# Patient Record
Sex: Female | Born: 1955 | Race: Black or African American | Hispanic: No | Smoking: Former smoker
Health system: Southern US, Community
[De-identification: ages and names within clinical notes are randomized; demographics above are authoritative.]

## PROBLEM LIST (undated history)

## (undated) DIAGNOSIS — K219 Gastro-esophageal reflux disease without esophagitis: Secondary | ICD-10-CM

## (undated) DIAGNOSIS — M25519 Pain in unspecified shoulder: Secondary | ICD-10-CM

## (undated) DIAGNOSIS — F329 Major depressive disorder, single episode, unspecified: Secondary | ICD-10-CM

## (undated) DIAGNOSIS — M1990 Unspecified osteoarthritis, unspecified site: Secondary | ICD-10-CM

## (undated) DIAGNOSIS — I10 Essential (primary) hypertension: Secondary | ICD-10-CM

## (undated) DIAGNOSIS — F32A Depression, unspecified: Secondary | ICD-10-CM

## (undated) DIAGNOSIS — G8929 Other chronic pain: Secondary | ICD-10-CM

## (undated) DIAGNOSIS — M549 Dorsalgia, unspecified: Secondary | ICD-10-CM

## (undated) HISTORY — PX: FOOT FRACTURE SURGERY: SHX645

## (undated) HISTORY — PX: SPINAL FUSION: SHX223

## (undated) HISTORY — DX: Depression, unspecified: F32.A

## (undated) HISTORY — DX: Major depressive disorder, single episode, unspecified: F32.9

## (undated) HISTORY — PX: ROTATOR CUFF REPAIR: SHX139

## (undated) HISTORY — DX: Gastro-esophageal reflux disease without esophagitis: K21.9

---

## 2009-07-01 HISTORY — PX: REPLACEMENT TOTAL KNEE BILATERAL: SUR1225

## 2017-08-26 ENCOUNTER — Emergency Department (HOSPITAL_COMMUNITY)
Admission: EM | Admit: 2017-08-26 | Discharge: 2017-08-26 | Disposition: A | Discharge status: Home / Self Care | Payer: Medicaid - Out of State | Attending: Emergency Medicine | Admitting: Emergency Medicine

## 2017-08-26 ENCOUNTER — Other Ambulatory Visit: Payer: Self-pay

## 2017-08-26 ENCOUNTER — Encounter (HOSPITAL_COMMUNITY): Payer: Self-pay | Admitting: Emergency Medicine

## 2017-08-26 DIAGNOSIS — Z87891 Personal history of nicotine dependence: Secondary | ICD-10-CM | POA: Diagnosis not present

## 2017-08-26 DIAGNOSIS — G8929 Other chronic pain: Secondary | ICD-10-CM | POA: Diagnosis not present

## 2017-08-26 DIAGNOSIS — M25511 Pain in right shoulder: Secondary | ICD-10-CM | POA: Diagnosis present

## 2017-08-26 DIAGNOSIS — Z96653 Presence of artificial knee joint, bilateral: Secondary | ICD-10-CM | POA: Insufficient documentation

## 2017-08-26 DIAGNOSIS — I10 Essential (primary) hypertension: Secondary | ICD-10-CM | POA: Insufficient documentation

## 2017-08-26 HISTORY — DX: Unspecified osteoarthritis, unspecified site: M19.90

## 2017-08-26 HISTORY — DX: Essential (primary) hypertension: I10

## 2017-08-26 MED ORDER — BACLOFEN 10 MG PO TABS: 10.0000 mg | ORAL_TABLET | Freq: Three times a day (TID) | ORAL | 0 refills | Status: DC

## 2017-08-26 NOTE — ED Provider Notes (Signed)
[facility] EMERGENCY DEPARTMENT Provider Note   CSN: [id number] Arrival date & time: 08/26/17  1101     History   Chief Complaint Chief Complaint  Patient presents with  . Shoulder Pain    HPI Melissa Solis is a 62 y.o. female.  HPI  Melissa Solis is a 62 y.o. female who presents to the Emergency Department complaining of right shoulder pain for 5 weeks.  She states she recently moved to this area from  where she had surgical repair of a rotator cuff injury 5 weeks ago.  She states that she was in her fourth week of physical therapy when she moved to this area.  She states she is been unable to follow-up with an orthopedic or primary care provider.  She states she is currently out of her pain medication complains of pain associated with movement of her right arm.  She denies neck pain, numbness, swelling of the extremity, fever, chills, and chest pain.  No recent injury noted   Past Medical History:  Diagnosis Date  . Arthritis   . Hypertension     There are no active problems to display for this patient.   Past Surgical History:  Procedure Laterality Date  . REPLACEMENT TOTAL KNEE BILATERAL    . ROTATOR CUFF REPAIR    . SPINAL FUSION      OB History    No data available       Home Medications    Prior to Admission medications   Medication Sig Start Date End Date Taking? Authorizing Provider  baclofen (LIORESAL) 10 MG tablet Take 1 tablet (10 mg total) by mouth 3 (three) times daily. 08/26/17   Pauline Aus, PA-C    Family History No family history on file.  Social History Social History   Tobacco Use  . Smoking status: Former Games developer  . Smokeless tobacco: Never Used  Substance Use Topics  . Alcohol use: No    Frequency: Never  . Drug use: No     Allergies   Patient has no allergy information on record.   Review of Systems Review of Systems  Constitutional: Negative for chills and fever.  Cardiovascular: Negative for chest  pain.  Gastrointestinal: Negative for abdominal pain, nausea and vomiting.  Musculoskeletal: Positive for arthralgias (Right shoulder pain). Negative for joint swelling, neck pain and neck stiffness.  Skin: Negative for color change and wound.  Neurological: Negative for weakness, numbness and headaches.  All other systems reviewed and are negative.    Physical Exam Updated Vital Signs BP (!) 143/98 (BP Location: Right Arm)   Pulse 75   Temp 98.6 F (37 C) (Oral)   Resp 18   Ht 5\' 6"  (1.676 m)   Wt 96.2 kg (212 lb)   SpO2 100%   BMI 34.22 kg/m   Physical Exam  Constitutional: She is oriented to person, place, and time. She appears well-developed and well-nourished. No distress.  HENT:  Head: Normocephalic and atraumatic.  Neck: Normal range of motion. Neck supple. No thyromegaly present.  Cardiovascular: Normal rate, regular rhythm and intact distal pulses.  No murmur heard. Radial pulses strong and palpable bilaterally  Pulmonary/Chest: Effort normal and breath sounds normal. No respiratory distress. She exhibits no tenderness.  Musculoskeletal: She exhibits tenderness. She exhibits no edema.  Tender to palpation of the anterior and lateral right shoulder.  Well healing surgical scars present.  Pain with abduction at 30 degrees.  No edema, erythema, or excessive warmth.  5 out of 5  grip strength of the upper extremities.  Lymphadenopathy:    She has no cervical adenopathy.  Neurological: She is alert and oriented to person, place, and time. She has normal strength. No sensory deficit. She exhibits normal muscle tone. Coordination normal.  Reflex Scores:      Tricep reflexes are 1+ on the right side and 2+ on the left side.      Bicep reflexes are 1+ on the right side and 2+ on the left side. Skin: Skin is warm. Capillary refill takes less than 2 seconds. No rash noted.  Psychiatric: She has a normal mood and affect.  Nursing note and vitals reviewed.    ED Treatments /  Results  Labs (all labs ordered are listed, but only abnormal results are displayed) Labs Reviewed - No data to display  EKG  EKG Interpretation None       Radiology No results found.  Procedures Procedures (including critical care time)  Medications Ordered in ED Medications - No data to display   Initial Impression / Assessment and Plan / ED Course  I have reviewed the triage vital signs and the nursing notes.  Pertinent labs & imaging results that were available during my care of the patient were reviewed by me and considered in my medical decision making (see chart for details).     Patient with acute on chronic right shoulder pain.  Neurovascularly intact.  No concerning symptoms for septic joint.  No new injuries.  Patient is requesting narcotic pain medication, I have explained that she will need to arrange follow-up appointment or establish primary care for ongoing pain management, she appears stable for discharge and agrees to treatment plan.  We will give referral information for local orthopedics to reestablish physical therapy   Final Clinical Impressions(s) / ED Diagnoses   Final diagnoses:  Chronic right shoulder pain    ED Discharge Orders        Ordered    baclofen (LIORESAL) 10 MG tablet  3 times daily     08/26/17 1433       Pauline Ausriplett, Kamrin Sibley, PA-C 08/26/17 2043    Loren RacerYelverton, David, MD 08/29/17 1526

## 2017-08-26 NOTE — ED Triage Notes (Signed)
Pt has rotator cuff surgery in January on right shoulder. C/o of pain and limited movement. Pt states she wants to get into therapy.

## 2017-08-26 NOTE — Discharge Instructions (Signed)
Call Dr. Mort SawyersHarrison's office to arrange a follow-up appointment.  Continue to apply ice packs on and off to your shoulder.  Return to the ER for any worsening symptoms  [facility] Doctor List    Kari BaarsEdward Hawkins MD. Specialty: Pulmonary Disease Contact information: [address]  [address]  [place] [address]  [phone]   Syliva OvermanMargaret Simpson, MD. Specialty: [facility] Contact information: [address]  [place] [address]  [phone]   Lilyan PuntScott Luking, MD. Specialty: [facility] Contact information: [address]  [address]  [place] [address]  [phone]   Avon Gullyesfaye Fanta, MD Specialty: Internal Medicine Contact information: [address]   [place] [address]  [phone]   Catalina PizzaZach Hall, MD. Specialty: Internal Medicine Contact information: [address]  [place] [address]  [phone]    [facility] (Dr. Selena BattenKim) Specialty: Family Medicine Contact information: [address]  [place] [address]  [phone]   John GiovanniStephen Knowlton, MD. Specialty: [facility] Contact information: [address]  [address]  [place] [address]  [phone]   Carylon Perchesoy Fagan, MD. Specialty: Internal Medicine Contact information: [address]  [address]  [place] [address]  [phone]    [facility]  [address] [place] [address] [phone]  Services [facility] offers a variety of basic health services.  Services include but are not limited to: Blood pressure checks  Heart rate checks  Blood sugar checks  Urine analysis  Rapid strep tests  Pregnancy tests.  Health education and referrals  People needing more complex services will be directed to a physician online. Using these virtual visits, doctors can evaluate and prescribe medicine and treatments. There will be no medication on-site, though [facility] will help patients fill their prescriptions at little to no cost.   For More information please go to: [email or web address]

## 2017-09-04 ENCOUNTER — Encounter: Payer: Self-pay | Admitting: Physician Assistant

## 2017-09-04 ENCOUNTER — Ambulatory Visit: Payer: Self-pay | Admitting: Physician Assistant

## 2017-09-04 VITALS — BP 122/90 | HR 79 | Temp 97.5°F | Ht 65.75 in | Wt 214.5 lb

## 2017-09-04 DIAGNOSIS — Z13220 Encounter for screening for lipoid disorders: Secondary | ICD-10-CM

## 2017-09-04 DIAGNOSIS — Z131 Encounter for screening for diabetes mellitus: Secondary | ICD-10-CM

## 2017-09-04 DIAGNOSIS — E669 Obesity, unspecified: Secondary | ICD-10-CM

## 2017-09-04 DIAGNOSIS — M51369 Other intervertebral disc degeneration, lumbar region without mention of lumbar back pain or lower extremity pain: Secondary | ICD-10-CM

## 2017-09-04 DIAGNOSIS — I10 Essential (primary) hypertension: Secondary | ICD-10-CM

## 2017-09-04 DIAGNOSIS — Z981 Arthrodesis status: Secondary | ICD-10-CM

## 2017-09-04 DIAGNOSIS — Z1239 Encounter for other screening for malignant neoplasm of breast: Secondary | ICD-10-CM

## 2017-09-04 DIAGNOSIS — Z9889 Other specified postprocedural states: Secondary | ICD-10-CM

## 2017-09-04 DIAGNOSIS — M5136 Other intervertebral disc degeneration, lumbar region: Secondary | ICD-10-CM

## 2017-09-04 DIAGNOSIS — M25511 Pain in right shoulder: Secondary | ICD-10-CM

## 2017-09-04 DIAGNOSIS — Z7689 Persons encountering health services in other specified circumstances: Secondary | ICD-10-CM

## 2017-09-04 DIAGNOSIS — M48061 Spinal stenosis, lumbar region without neurogenic claudication: Secondary | ICD-10-CM

## 2017-09-04 MED ORDER — METOPROLOL TARTRATE 100 MG PO TABS
100.0000 mg | ORAL_TABLET | Freq: Two times a day (BID) | ORAL | 1 refills | Status: DC
Start: 2017-09-04 — End: 2017-09-11

## 2017-09-04 MED ORDER — VALSARTAN-HYDROCHLOROTHIAZIDE 160-12.5 MG PO TABS: 1.0000 | ORAL_TABLET | Freq: Every day | ORAL | 1 refills | Status: DC

## 2017-09-04 NOTE — Patient Instructions (Signed)
-  mammogram -Labs- -medassist-  Omeprazole, valsartan, hctz, metoprolol -Physical therapy for shoulder - orthopedist for shoulder and back [facility]-Charity care application

## 2017-09-04 NOTE — Progress Notes (Signed)
BP 122/90 (BP Location: Left Arm, Patient Position: Sitting, Cuff Size: Normal)   Pulse 79   Temp (!) 97.5 F (36.4 C)   Ht 5' 5.75" (1.67 m)   Wt 214 lb 8 oz (97.3 kg)   SpO2 99%   BMI 34.88 kg/m    Subjective:    Patient ID: Melissa Solis, female    DOB: 1955/10/13, 63 y.o.   MRN: [id number]  HPI: Melissa Solis is a 62 y.o. female presenting on 09/04/2017 for New Patient (Initial Visit) (pt moved from [place] on 08-21-17.)   HPI   Chief Complaint  Patient presents with  . New Patient (Initial Visit)    pt moved from [place] on 08-21-17.   She is living with her son and daughter-in-law and their 6 children.  She says it is a somewhat stressful situation because several of the teens are disrespectful.  She also does not have her own room and is sleeping in the living room.  She is hoping to get a place of her own to live.    Pt had Recent R shoulder surgery- arthroscopic rotator cuff repair, double row, on 07/24/2017 in [place].  She had started physical therapy prior to moving and can tell that it has worsened since she is no longer doing her therapy.    She Thinks she might need medication for depression due to the stress of moving and her current living situation.   Pt states back pain- chronic-  She had fusion in 2013.  she had MRI of lumbar spine November 2018.  pt says she was told she needed back surgery.  Review of office note states surgery was NOT recommended.   She saw orthopedist in December for her back.     Pt has been on blood pressure medication for years.  She denies history of heart problems.    Pt quit smoking in 2018.    Relevant past medical, surgical, family and social history reviewed and updated as indicated. Interim medical history since our last visit reviewed. Allergies and medications reviewed and updated.   Current Outpatient Medications:  .  baclofen (LIORESAL) 10 MG tablet, Take 1 tablet (10 mg total) by mouth 3 (three) times daily. (Patient taking  differently: Take 10 mg by mouth 2 (two) times daily as needed for muscle spasms (pain). ), Disp: 30 each, Rfl: 0 .  cetirizine (ZYRTEC) 10 MG tablet, Take 10 mg by mouth daily., Disp: , Rfl:  .  diclofenac sodium (VOLTAREN) 1 % GEL, Apply topically as needed., Disp: , Rfl:  .  DOCUSATE SODIUM PO, Take 1 tablet by mouth as needed., Disp: , Rfl:  .  lansoprazole (PREVACID) 15 MG capsule, Take 15 mg by mouth daily at 12 Solis., Disp: , Rfl:  .  lidocaine (LIDODERM) 5 %, Place 1 patch onto the skin as needed (moderate pain). Leave on for 12 hrs and then off for 12 hours, Disp: , Rfl:  .  nabumetone (RELAFEN) 500 MG tablet, Take 500 mg by mouth daily., Disp: , Rfl:  .  Nebivolol HCl (BYSTOLIC) 20 MG TABS, Take 1 tablet by mouth daily., Disp: , Rfl:  .  valsartan-hydrochlorothiazide (DIOVAN-HCT) 160-12.5 MG tablet, Take 1 tablet by mouth daily., Disp: , Rfl:   Review of Systems  Constitutional: Positive for appetite change, fatigue and unexpected weight change. Negative for chills, diaphoresis and fever.  HENT: Positive for ear pain and facial swelling. Negative for congestion, drooling, hearing loss, mouth sores, sneezing, sore throat, trouble  swallowing and voice change.   Eyes: Positive for pain and visual disturbance. Negative for discharge, redness and itching.  Respiratory: Positive for shortness of breath and wheezing. Negative for cough and choking.   Cardiovascular: Positive for palpitations and leg swelling. Negative for chest pain.  Gastrointestinal: Negative for abdominal pain, blood in stool, constipation, diarrhea and vomiting.  Endocrine: Positive for cold intolerance. Negative for heat intolerance and polydipsia.  Genitourinary: Negative for decreased urine volume, dysuria and hematuria.  Musculoskeletal: Positive for arthralgias, back pain and gait problem.  Skin: Negative for rash.  Allergic/Immunologic: Negative for environmental allergies.  Neurological: Negative for seizures,  syncope, light-headedness and headaches.  Hematological: Negative for adenopathy.  Psychiatric/Behavioral: Positive for agitation and dysphoric mood. Negative for suicidal ideas. The patient is nervous/anxious.     Per HPI unless specifically indicated above     Objective:    BP 122/90 (BP Location: Left Arm, Patient Position: Sitting, Cuff Size: Normal)   Pulse 79   Temp (!) 97.5 F (36.4 C)   Ht 5' 5.75" (1.67 m)   Wt 214 lb 8 oz (97.3 kg)   SpO2 99%   BMI 34.88 kg/m   Wt Readings from Last 3 Encounters:  09/04/17 214 lb 8 oz (97.3 kg)  08/26/17 212 lb (96.2 kg)    Physical Exam  Constitutional: She is oriented to person, place, and time. She appears well-developed and well-nourished.  HENT:  Head: Normocephalic and atraumatic.  Mouth/Throat: Oropharynx is clear and moist. No oropharyngeal exudate.  Eyes: Conjunctivae and EOM are normal. Pupils are equal, round, and reactive to light.  Neck: Neck supple. No thyromegaly present.  Cardiovascular: Normal rate and regular rhythm.  Pulmonary/Chest: Effort normal and breath sounds normal.  Abdominal: Soft. Bowel sounds are normal. She exhibits no mass. There is no hepatosplenomegaly. There is no tenderness.  Musculoskeletal: She exhibits no edema.       Right shoulder: She exhibits decreased range of motion and tenderness. She exhibits no bony tenderness, no swelling and normal pulse.       Lumbar back: She exhibits decreased range of motion and tenderness.  Lymphadenopathy:    She has no cervical adenopathy.  Neurological: She is alert and oriented to person, place, and time. Gait normal.  Skin: Skin is warm and dry.  Psychiatric: She has a normal mood and affect. Her behavior is normal.  Vitals reviewed.    No results found for this or any previous visit.    Assessment & Plan:    Encounter Diagnoses  Name Primary?  . Encounter to establish care Yes  . Essential hypertension   . S/P shoulder surgery   . Right  shoulder pain, unspecified chronicity   . DDD (degenerative disc disease), lumbar   . History of lumbar fusion   . Spinal stenosis of lumbar region, unspecified whether neurogenic claudication present   . Screening cholesterol level   . Screening for diabetes mellitus   . Screening for breast cancer   . Obesity, unspecified classification, unspecified obesity type, unspecified whether serious comorbidity present       -ordered screening mammogram -will get baseline labs -will get pt signed up for medassist- (Omeprazole, valsartan, hctz, metoprolol) -refer for Physical therapy for shoulder -Refer to orthopedist for shoulder and back -pt was given application for [facility] care  -At next OV in one month, will discuss mood, colon cancer screening, cervical cancer screening, nutrition.  Pt to RTO sooner prn

## 2017-09-05 ENCOUNTER — Other Ambulatory Visit (HOSPITAL_COMMUNITY)
Admission: RE | Admit: 2017-09-05 | Discharge: 2017-09-05 | Disposition: A | Discharge status: Home / Self Care | Payer: Medicaid - Out of State | Source: Ambulatory Visit | Attending: Physician Assistant | Admitting: Physician Assistant

## 2017-09-05 DIAGNOSIS — Z7689 Persons encountering health services in other specified circumstances: Secondary | ICD-10-CM

## 2017-09-05 DIAGNOSIS — Z13220 Encounter for screening for lipoid disorders: Secondary | ICD-10-CM

## 2017-09-05 DIAGNOSIS — Z131 Encounter for screening for diabetes mellitus: Secondary | ICD-10-CM

## 2017-09-05 DIAGNOSIS — I10 Essential (primary) hypertension: Secondary | ICD-10-CM

## 2017-09-05 LAB — CBC
HCT: 35.1 % — ABNORMAL LOW (ref 36.0–46.0)
HEMOGLOBIN: 11.5 g/dL — AB (ref 12.0–15.0)
MCH: 29.6 pg (ref 26.0–34.0)
MCHC: 32.8 g/dL (ref 30.0–36.0)
MCV: 90.2 fL (ref 78.0–100.0)
Platelets: 272 10*3/uL (ref 150–400)
RBC: 3.89 MIL/uL (ref 3.87–5.11)
RDW: 12.6 % (ref 11.5–15.5)
WBC: 3.2 10*3/uL — ABNORMAL LOW (ref 4.0–10.5)

## 2017-09-05 LAB — COMPREHENSIVE METABOLIC PANEL
ALBUMIN: 4.1 g/dL (ref 3.5–5.0)
ALT: 14 U/L (ref 14–54)
ANION GAP: 10 (ref 5–15)
AST: 18 U/L (ref 15–41)
Alkaline Phosphatase: 93 U/L (ref 38–126)
BILIRUBIN TOTAL: 0.7 mg/dL (ref 0.3–1.2)
BUN: 24 mg/dL — ABNORMAL HIGH (ref 6–20)
CO2: 26 mmol/L (ref 22–32)
Calcium: 9.3 mg/dL (ref 8.9–10.3)
Chloride: 101 mmol/L (ref 101–111)
Creatinine, Ser: 1.19 mg/dL — ABNORMAL HIGH (ref 0.44–1.00)
GFR calc Af Amer: 56 mL/min — ABNORMAL LOW (ref >60)
GFR calc non Af Amer: 48 mL/min — ABNORMAL LOW (ref >60)
GLUCOSE: 104 mg/dL — AB (ref 65–99)
POTASSIUM: 4 mmol/L (ref 3.5–5.1)
Sodium: 137 mmol/L (ref 135–145)
TOTAL PROTEIN: 7.6 g/dL (ref 6.5–8.1)

## 2017-09-05 LAB — LIPID PANEL
CHOL/HDL RATIO: 3.2 ratio
CHOLESTEROL: 128 mg/dL (ref 0–200)
HDL: 40 mg/dL — ABNORMAL LOW (ref >40)
LDL Cholesterol: 75 mg/dL (ref 0–99)
Triglycerides: 65 mg/dL (ref <150)
VLDL: 13 mg/dL (ref 0–40)

## 2017-09-06 ENCOUNTER — Other Ambulatory Visit: Payer: Self-pay

## 2017-09-06 ENCOUNTER — Emergency Department (HOSPITAL_COMMUNITY)
Admission: EM | Admit: 2017-09-06 | Discharge: 2017-09-06 | Disposition: A | Discharge status: Home / Self Care | Payer: Medicaid - Out of State | Attending: Emergency Medicine | Admitting: Emergency Medicine

## 2017-09-06 ENCOUNTER — Encounter (HOSPITAL_COMMUNITY): Payer: Self-pay | Admitting: Emergency Medicine

## 2017-09-06 ENCOUNTER — Emergency Department (HOSPITAL_COMMUNITY): Payer: Medicaid - Out of State

## 2017-09-06 DIAGNOSIS — Y939 Activity, unspecified: Secondary | ICD-10-CM | POA: Diagnosis not present

## 2017-09-06 DIAGNOSIS — Z79899 Other long term (current) drug therapy: Secondary | ICD-10-CM | POA: Diagnosis not present

## 2017-09-06 DIAGNOSIS — S40011A Contusion of right shoulder, initial encounter: Secondary | ICD-10-CM | POA: Diagnosis not present

## 2017-09-06 DIAGNOSIS — Y998 Other external cause status: Secondary | ICD-10-CM | POA: Insufficient documentation

## 2017-09-06 DIAGNOSIS — Z87891 Personal history of nicotine dependence: Secondary | ICD-10-CM | POA: Diagnosis not present

## 2017-09-06 DIAGNOSIS — I10 Essential (primary) hypertension: Secondary | ICD-10-CM | POA: Insufficient documentation

## 2017-09-06 DIAGNOSIS — W2209XA Striking against other stationary object, initial encounter: Secondary | ICD-10-CM | POA: Insufficient documentation

## 2017-09-06 DIAGNOSIS — Y929 Unspecified place or not applicable: Secondary | ICD-10-CM | POA: Insufficient documentation

## 2017-09-06 DIAGNOSIS — S4991XA Unspecified injury of right shoulder and upper arm, initial encounter: Secondary | ICD-10-CM | POA: Diagnosis present

## 2017-09-06 HISTORY — DX: Other chronic pain: G89.29

## 2017-09-06 HISTORY — DX: Dorsalgia, unspecified: M54.9

## 2017-09-06 HISTORY — DX: Pain in unspecified shoulder: M25.519

## 2017-09-06 LAB — HEMOGLOBIN A1C
Hgb A1c MFr Bld: 6 % — ABNORMAL HIGH (ref 4.8–5.6)
MEAN PLASMA GLUCOSE: 126 mg/dL

## 2017-09-06 MED ORDER — ONDANSETRON HCL 4 MG PO TABS
4.0000 mg | ORAL_TABLET | Freq: Once | ORAL | Status: AC
  Administered 2017-09-06: 4 mg via ORAL
  Filled 2017-09-06: qty 1

## 2017-09-06 MED ORDER — DEXAMETHASONE SODIUM PHOSPHATE 10 MG/ML IJ SOLN
10.0000 mg | Freq: Once | INTRAMUSCULAR | Status: AC
  Administered 2017-09-06: 10 mg via INTRAMUSCULAR
  Filled 2017-09-06: qty 1

## 2017-09-06 MED ORDER — KETOROLAC TROMETHAMINE 60 MG/2ML IM SOLN
60.0000 mg | Freq: Once | INTRAMUSCULAR | Status: AC
  Administered 2017-09-06: 60 mg via INTRAMUSCULAR
  Filled 2017-09-06: qty 2

## 2017-09-06 NOTE — ED Triage Notes (Signed)
Patient complains of right shoulder pain after hitting it on a door frame today at 12pm. She states nothing makes it better. She states moving it make it worse. She states the pain is aching and stabbing. She says she had rotator cuff surgery Janurary 24th, 2019.

## 2017-09-06 NOTE — ED Provider Notes (Signed)
[facility] EMERGENCY DEPARTMENT Provider Note   CSN: [id number] Arrival date & time: 09/06/17  1452     History   Chief Complaint Chief Complaint  Patient presents with  . Shoulder Pain    HPI Chesley NoonLashel Kratky is a 62 y.o. female.  Patient is a 62 year old female who presents to the emergency department with a complaint of right shoulder pain.  The patient states that in January 2018 she had surgery on the rotator cuff.  Since that time she has moved to this area.  She was taking physical therapy, but is not had any physical therapy since the end of January.  She is attempting to be reestablished with a primary physician here in town, and also to obtain benefits.  She states that she has been having pain in her right shoulder, but this is become worse since she bumped it last night on a door frame.  She says she has pain that goes from the shoulder down to her fingertips.  She has good function.  But states that at that time she had the severe pain and she has some residual of it today.  The patient also complains of a problem with where she is staying.  She has very little heat and she states her arm throbs because of the cold damp weather.  She presents at this time for assessment after having hit her shoulder.      Past Medical History:  Diagnosis Date  . Arthritis   . Chronic back pain   . Chronic shoulder pain   . Depression   . GERD (gastroesophageal reflux disease)   . Hypertension     There are no active problems to display for this patient.   Past Surgical History:  Procedure Laterality Date  . FOOT FRACTURE SURGERY Left   . REPLACEMENT TOTAL KNEE BILATERAL  2011  . ROTATOR CUFF REPAIR Right   . SPINAL FUSION      OB History    No data available       Home Medications    Prior to Admission medications   Medication Sig Start Date End Date Taking? Authorizing Provider  baclofen (LIORESAL) 10 MG tablet Take 1 tablet (10 mg total) by mouth 3 (three) times  daily. Patient taking differently: Take 10 mg by mouth 2 (two) times daily as needed for muscle spasms (pain).  08/26/17   Triplett, Tammy, PA-C  cetirizine (ZYRTEC) 10 MG tablet Take 10 mg by mouth daily.    [provider]  diclofenac sodium (VOLTAREN) 1 % GEL Apply topically as needed.    [provider]  DOCUSATE SODIUM PO Take 1 tablet by mouth as needed.    [provider]  lansoprazole (PREVACID) 15 MG capsule Take 15 mg by mouth daily at 12 noon.    [provider]  lidocaine (LIDODERM) 5 % Place 1 patch onto the skin as needed (moderate pain). Leave on for 12 hrs and then off for 12 hours    [provider]  metoprolol tartrate (LOPRESSOR) 100 MG tablet Take 1 tablet (100 mg total) by mouth 2 (two) times daily. 09/04/17   Jacquelin HawkingMcElroy, Shannon, PA-C  nabumetone (RELAFEN) 500 MG tablet Take 500 mg by mouth daily.    [provider]  valsartan-hydrochlorothiazide (DIOVAN-HCT) 160-12.5 MG tablet Take 1 tablet by mouth daily. 09/04/17   Jacquelin HawkingMcElroy, Shannon, PA-C    Family History Family History  Problem Relation Age of Onset  . Hypertension Mother   . Diabetes  Mother   . Hypertension Sister   . Stroke Sister   . Diabetes Brother   . Heart disease Brother   . Stroke Brother   . Hypertension Maternal Uncle   . Heart attack Maternal Uncle   . Stroke Maternal Uncle   . Hypertension Maternal Grandfather   . Heart disease Maternal Grandfather     Social History Social History   Tobacco Use  . Smoking status: Former Smoker    Packs/day: 0.25    Years: 20.00    Pack years: 5.00    Types: Cigarettes    Last attempt to quit: 09/20/2016    Years since quitting: 0.9  . Smokeless tobacco: Never Used  Substance Use Topics  . Alcohol use: No    Frequency: Never    Comment: wine "every now and then"  . Drug use: No     Allergies   Ace inhibitors   Review of Systems Review of Systems  Constitutional: Negative for activity change.        All ROS Neg except as noted in HPI  HENT: Negative for nosebleeds.   Eyes: Negative for photophobia and discharge.  Respiratory: Negative for cough, shortness of breath and wheezing.   Cardiovascular: Negative for chest pain and palpitations.  Gastrointestinal: Negative for abdominal pain and blood in stool.  Genitourinary: Negative for dysuria, frequency and hematuria.  Musculoskeletal: Positive for arthralgias. Negative for back pain and neck pain.  Skin: Negative.   Neurological: Negative for dizziness, seizures and speech difficulty.  Psychiatric/Behavioral: Negative for confusion and hallucinations.     Physical Exam Updated Vital Signs BP (!) 142/88 (BP Location: Left Arm)   Pulse 90   Temp 98.8 F (37.1 C) (Oral)   Resp 16   Ht 5\' 6"  (1.676 m)   Wt 98.9 kg (218 lb)   SpO2 100%   BMI 35.19 kg/m   Physical Exam  Constitutional: She is oriented to person, place, and time. She appears well-developed and well-nourished.  Non-toxic appearance.  HENT:  Head: Normocephalic.  Right Ear: Tympanic membrane and external ear normal.  Left Ear: Tympanic membrane and external ear normal.  Eyes: EOM and lids are normal. Pupils are equal, round, and reactive to light.  Neck: Normal range of motion. Neck supple. Carotid bruit is not present.  Cardiovascular: Normal rate, regular rhythm, normal heart sounds, intact distal pulses and normal pulses.  Pulmonary/Chest: Breath sounds normal. No respiratory distress.  Abdominal: Soft. Bowel sounds are normal. There is no tenderness. There is no guarding.  Musculoskeletal:       Right shoulder: She exhibits decreased range of motion and tenderness. She exhibits no swelling and normal pulse.  There are no hot areas involving the right shoulder.  The brachial and radial pulses are 2+.  Capillary refill is less than 2 seconds.  There is decreased range of motion because of pain.  There is no evidence of a dislocation.  There is pain with  attempted range of motion.  And there is pain to palpation at the anterior shoulder and the bicep tricep area.  Lymphadenopathy:       Head (right side): No submandibular adenopathy present.       Head (left side): No submandibular adenopathy present.    She has no cervical adenopathy.  Neurological: She is alert and oriented to person, place, and time. She has normal strength. No cranial nerve deficit or sensory deficit.  Pt is ambulatory without problem.  Skin: Skin is warm and dry.  Psychiatric: She has a normal mood and affect. Her speech is normal.  Nursing note and vitals reviewed.    ED Treatments / Results  Labs (all labs ordered are listed, but only abnormal results are displayed) Labs Reviewed - No data to display  EKG  EKG Interpretation None       Radiology No results found.  Procedures Procedures (including critical care time)  Medications Ordered in ED Medications - No data to display   Initial Impression / Assessment and Plan / ED Course  I have reviewed the triage vital signs and the nursing notes.  Pertinent labs & imaging results that were available during my care of the patient were reviewed by me and considered in my medical decision making (see chart for details).       Final Clinical Impressions(s) / ED Diagnoses  MDM  Patient bumped her shoulder in a door on last evening.  She had rotator cuff surgery in January, and is not had physical therapy for over a month.  There are no neurovascular deficits appreciated.  There is no evidence of any dislocation on examination.  The patient was treated in the emergency department with intramuscular Toradol and Decadron.  I have asked the patient to use Tylenol and ibuprofen for soreness.  To follow-up with Jacquelin Hawking in the office.  Patient is in agreement with this plan at this time.   Final diagnoses:  Contusion of right shoulder, initial encounter    ED Discharge Orders    None         Ivery Quale, PA-C 09/06/17 1605    Eber Hong, MD 09/06/17 [phone]

## 2017-09-06 NOTE — Discharge Instructions (Signed)
The pulses to your upper extremities are well within normal limits.  No gross neurologic deficits appreciated.  There is no hot joint that would cause suspicion of infection within the joint area.  You were treated in the emergency department with intramuscular Toradol and intramuscular Decadron.  Please use 600 mg of ibuprofen and 500 mg of Tylenol every 6 hours as needed for shoulder pain.  I think some

## 2017-09-08 ENCOUNTER — Other Ambulatory Visit: Payer: Self-pay | Admitting: Physician Assistant

## 2017-09-08 MED ORDER — IRBESARTAN 150 MG PO TABS: 150.0000 mg | ORAL_TABLET | Freq: Every day | ORAL | 0 refills | Status: DC

## 2017-09-08 MED ORDER — HYDROCHLOROTHIAZIDE 12.5 MG PO CAPS: 12.5000 mg | ORAL_CAPSULE | Freq: Every day | ORAL | 0 refills | Status: DC

## 2017-09-11 ENCOUNTER — Other Ambulatory Visit: Payer: Self-pay | Admitting: Physician Assistant

## 2017-09-11 ENCOUNTER — Encounter: Payer: Self-pay | Admitting: Physician Assistant

## 2017-09-11 ENCOUNTER — Ambulatory Visit: Payer: Medicaid - Out of State | Admitting: Physician Assistant

## 2017-09-11 VITALS — BP 107/78 | HR 90 | Temp 97.5°F | Ht 65.75 in | Wt 216.5 lb

## 2017-09-11 DIAGNOSIS — R7303 Prediabetes: Secondary | ICD-10-CM

## 2017-09-11 DIAGNOSIS — I10 Essential (primary) hypertension: Secondary | ICD-10-CM | POA: Insufficient documentation

## 2017-09-11 DIAGNOSIS — D72819 Decreased white blood cell count, unspecified: Secondary | ICD-10-CM

## 2017-09-11 DIAGNOSIS — D649 Anemia, unspecified: Secondary | ICD-10-CM

## 2017-09-11 DIAGNOSIS — R42 Dizziness and giddiness: Secondary | ICD-10-CM

## 2017-09-11 DIAGNOSIS — N189 Chronic kidney disease, unspecified: Secondary | ICD-10-CM | POA: Insufficient documentation

## 2017-09-11 MED ORDER — METOPROLOL TARTRATE 100 MG PO TABS: 100.0000 mg | ORAL_TABLET | Freq: Two times a day (BID) | ORAL | 1 refills | Status: DC

## 2017-09-11 MED ORDER — OMEPRAZOLE 40 MG PO CPDR: 40.0000 mg | DELAYED_RELEASE_CAPSULE | Freq: Every day | ORAL | 1 refills | Status: DC

## 2017-09-11 MED ORDER — IRON COMPLEX PO CAPS: ORAL_CAPSULE | ORAL | Status: DC

## 2017-09-11 MED ORDER — HYDROCHLOROTHIAZIDE 12.5 MG PO CAPS: 12.5000 mg | ORAL_CAPSULE | Freq: Every day | ORAL | 1 refills | Status: DC

## 2017-09-11 MED ORDER — VALSARTAN 160 MG PO TABS: 160.0000 mg | ORAL_TABLET | Freq: Every day | ORAL | 1 refills | Status: DC

## 2017-09-11 NOTE — Progress Notes (Signed)
BP 107/78 (BP Location: Right Arm, Patient Position: Sitting, Cuff Size: Normal)   Pulse 90   Temp (!) 97.5 F (36.4 C)   Ht 5' 5.75" (1.67 m)   Wt 216 lb 8 oz (98.2 kg)   SpO2 100%   BMI 35.21 kg/m    Subjective:    Patient ID: Melissa Solis, female    DOB: 1956-04-18, 62 y.o.   MRN: [id number]  HPI: Melissa Solis is a 62 y.o. female presenting on 09/11/2017 for Dizziness (going on for a while but is getting worse.)   HPI Pt c/o dizziness for several months but she says it's getting worse.   Many bp meds changed at last OV but she hasn't gotten them yet and is still taking her old meds.   She relates feeling lightheadeded and room spins.  Comes and goes.  Usually happens qd.  Lasts about 10seconds but has to "stop in her tracks".  No vision changes, HA, prodromal event.  No CP or sob.  No LOC.   Relevant past medical, surgical, family and social history reviewed and updated as indicated. Interim medical history since our last visit reviewed. Allergies and medications reviewed and updated.   Current Outpatient Medications:  .  baclofen (LIORESAL) 10 MG tablet, Take 1 tablet (10 mg total) by mouth 3 (three) times daily. (Patient taking differently: Take 10 mg by mouth 2 (two) times daily as needed for muscle spasms (pain). ), Disp: 30 each, Rfl: 0 .  cetirizine (ZYRTEC) 10 MG tablet, Take 10 mg by mouth daily., Disp: , Rfl:  .  diclofenac sodium (VOLTAREN) 1 % GEL, Apply topically as needed., Disp: , Rfl:  .  DOCUSATE SODIUM PO, Take 1 tablet by mouth as needed., Disp: , Rfl:  .  lansoprazole (PREVACID) 15 MG capsule, Take 15 mg by mouth daily at 12 Solis., Disp: , Rfl:  .  nabumetone (RELAFEN) 500 MG tablet, Take 500 mg by mouth daily., Disp: , Rfl:  .  nebivolol (BYSTOLIC) 10 MG tablet, Take 20 mg by mouth daily., Disp: , Rfl:  .  valsartan-hydrochlorothiazide (DIOVAN-HCT) 160-12.5 MG tablet, Take 1 tablet by mouth daily., Disp: 30 tablet, Rfl: 1 .  hydrochlorothiazide  (MICROZIDE) 12.5 MG capsule, Take 1 capsule (12.5 mg total) by mouth daily. (Patient not taking: Reported on 09/11/2017), Disp: 30 capsule, Rfl: 0 .  irbesartan (AVAPRO) 150 MG tablet, Take 1 tablet (150 mg total) by mouth daily. (Patient not taking: Reported on 09/11/2017), Disp: 30 tablet, Rfl: 0 .  lidocaine (LIDODERM) 5 %, Place 1 patch onto the skin as needed (moderate pain). Leave on for 12 hrs and then off for 12 hours, Disp: , Rfl:  .  metoprolol tartrate (LOPRESSOR) 100 MG tablet, Take 1 tablet (100 mg total) by mouth 2 (two) times daily. (Patient not taking: Reported on 09/11/2017), Disp: 60 tablet, Rfl: 1   Review of Systems  Constitutional: Positive for appetite change, fatigue and unexpected weight change. Negative for chills, diaphoresis and fever.  HENT: Positive for congestion. Negative for dental problem, drooling, ear pain, facial swelling, hearing loss, mouth sores, sneezing, sore throat, trouble swallowing and voice change.   Eyes: Positive for visual disturbance. Negative for pain, discharge, redness and itching.  Respiratory: Positive for shortness of breath and wheezing. Negative for cough and choking.   Cardiovascular: Positive for leg swelling. Negative for chest pain and palpitations.  Gastrointestinal: Negative for abdominal pain, blood in stool, constipation, diarrhea and vomiting.  Endocrine: Positive for polydipsia. Negative  for cold intolerance and heat intolerance.  Genitourinary: Positive for decreased urine volume. Negative for dysuria and hematuria.  Musculoskeletal: Positive for arthralgias, back pain and gait problem.  Skin: Negative for rash.  Allergic/Immunologic: Negative for environmental allergies.  Neurological: Negative for seizures, syncope, light-headedness and headaches.  Hematological: Negative for adenopathy.  Psychiatric/Behavioral: Positive for agitation and dysphoric mood. Negative for suicidal ideas. The patient is nervous/anxious.     Per HPI  unless specifically indicated above     Objective:    BP 107/78 (BP Location: Right Arm, Patient Position: Sitting, Cuff Size: Normal)   Pulse 90   Temp (!) 97.5 F (36.4 C)   Ht 5' 5.75" (1.67 m)   Wt 216 lb 8 oz (98.2 kg)   SpO2 100%   BMI 35.21 kg/m   Wt Readings from Last 3 Encounters:  09/11/17 216 lb 8 oz (98.2 kg)  09/06/17 218 lb (98.9 kg)  09/04/17 214 lb 8 oz (97.3 kg)    Orthostatic VS for the past 24 hrs:  BP- Lying Pulse- Lying BP- Sitting Pulse- Sitting BP- Standing at 0 minutes Pulse- Standing at 0 minutes  09/11/17 1331 116/88 88 110/82 99 120/90 105      Physical Exam  Constitutional: She is oriented to person, place, and time. She appears well-developed and well-nourished.  HENT:  Head: Normocephalic and atraumatic.  Right Ear: Hearing, tympanic membrane, external ear and ear canal normal.  Left Ear: Hearing, tympanic membrane, external ear and ear canal normal.  Nose: Nose normal.  Mouth/Throat: Uvula is midline and oropharynx is clear and moist. No oropharyngeal exudate.  Neck: Neck supple.  Cardiovascular: Normal rate and regular rhythm.  Pulmonary/Chest: Effort normal and breath sounds normal. She has no wheezes.  Abdominal: Soft. Bowel sounds are normal. She exhibits no mass. There is no hepatosplenomegaly. There is no tenderness.  Musculoskeletal: She exhibits no edema.  Lymphadenopathy:    She has no cervical adenopathy.  Neurological: She is alert and oriented to person, place, and time. She has normal strength. She displays no atrophy and no tremor. No cranial nerve deficit or sensory deficit. She exhibits normal muscle tone. Coordination and gait normal.  Skin: Skin is warm and dry.  Psychiatric: She has a normal mood and affect. Her behavior is normal.  Vitals reviewed.   Results for orders placed or performed during the hospital encounter of 09/05/17  Hemoglobin A1c  Result Value Ref Range   Hgb A1c MFr Bld 6.0 (H) 4.8 - 5.6 %   Mean  Plasma Glucose 126 mg/dL  CBC  Result Value Ref Range   WBC 3.2 (L) 4.0 - 10.5 K/uL   RBC 3.89 3.87 - 5.11 MIL/uL   Hemoglobin 11.5 (L) 12.0 - 15.0 g/dL   HCT 91.4 (L) 78.2 - 95.6 %   MCV 90.2 78.0 - 100.0 fL   MCH 29.6 26.0 - 34.0 pg   MCHC 32.8 30.0 - 36.0 g/dL   RDW 21.3 08.6 - 57.8 %   Platelets 272 150 - 400 K/uL  Lipid panel  Result Value Ref Range   Cholesterol 128 0 - 200 mg/dL   Triglycerides 65 <469 mg/dL   HDL 40 (L) >62 mg/dL   Total CHOL/HDL Ratio 3.2 RATIO   VLDL 13 0 - 40 mg/dL   LDL Cholesterol 75 0 - 99 mg/dL  Comprehensive metabolic panel  Result Value Ref Range   Sodium 137 135 - 145 mmol/L   Potassium 4.0 3.5 - 5.1 mmol/L  Chloride 101 101 - 111 mmol/L   CO2 26 22 - 32 mmol/L   Glucose, Bld 104 (H) 65 - 99 mg/dL   BUN 24 (H) 6 - 20 mg/dL   Creatinine, Ser 1.611.19 (H) 0.44 - 1.00 mg/dL   Calcium 9.3 8.9 - 09.610.3 mg/dL   Total Protein 7.6 6.5 - 8.1 g/dL   Albumin 4.1 3.5 - 5.0 g/dL   AST 18 15 - 41 U/L   ALT 14 14 - 54 U/L   Alkaline Phosphatase 93 38 - 126 U/L   Total Bilirubin 0.7 0.3 - 1.2 mg/dL   GFR calc non Af Amer 48 (L) >60 mL/min   GFR calc Af Amer 56 (L) >60 mL/min   Anion gap 10 5 - 15      Assessment & Plan:   Encounter Diagnoses  Name Primary?  . Dizzy spells Yes  . Essential hypertension   . Prediabetes   . Anemia, unspecified type   . Chronic kidney disease, unspecified CKD stage   . Leukopenia, unspecified type     -reviewed labs with pt -Add OTC iron supplement -pt to Drink plenty water -pt counseled to Eat tid (she currently eats only qd) -she is counseled to Avoid sweets -she is counseled on prediabetes and given reading information -will monitor kidney function.  Will refer to nephrology if worsens -will monitor WBCs -pt to follow up April as scheduled.  RTO sooner prn worsening or new symptoms

## 2017-09-11 NOTE — Patient Instructions (Signed)
Prediabetes Prediabetes is the condition of having a blood sugar (blood glucose) level that is higher than it should be, but not high enough for you to be diagnosed with type 2 diabetes. Having prediabetes puts you at risk for developing type 2 diabetes (type 2 diabetes mellitus). Prediabetes may be called impaired glucose tolerance or impaired fasting glucose. Prediabetes usually does not cause symptoms. Your health care provider can diagnose this condition with blood tests. You may be tested for prediabetes if you are overweight and if you have at least one other risk factor for prediabetes. Risk factors for prediabetes include:  Having a family member with type 2 diabetes.  Being overweight or obese.  Being older than age 45.  Being of American-Indian, African-American, Hispanic/Latino, or Asian/Pacific Islander descent.  Having an inactive (sedentary) lifestyle.  Having a history of gestational diabetes or polycystic ovarian syndrome (PCOS).  Having low levels of good cholesterol (HDL-C) or high levels of blood fats (triglycerides).  Having high blood pressure.  What is blood glucose and how is blood glucose measured?  Blood glucose refers to the amount of glucose in your bloodstream. Glucose comes from eating foods that contain sugars and starches (carbohydrates) that the body breaks down into glucose. Your blood glucose level may be measured in mg/dL (milligrams per deciliter) or mmol/L (millimoles per liter).Your blood glucose may be checked with one or more of the following blood tests:  A fasting blood glucose (FBG) test. You will not be allowed to eat (you will fast) for at least 8 hours before a blood sample is taken. ? A normal range for FBG is 70-100 mg/dl (3.9-5.6 mmol/L).  An A1c (hemoglobin A1c) blood test. This test provides information about blood glucose control over the previous 2?3months.  An oral glucose tolerance test (OGTT). This test measures your blood  glucose twice: ? After fasting. This is your baseline level. ? Two hours after you drink a beverage that contains glucose.  You may be diagnosed with prediabetes:  If your FBG is 100?125 mg/dL (5.6-6.9 mmol/L).  If your A1c level is 5.7?6.4%.  If your OGGT result is 140?199 mg/dL (7.8-11 mmol/L).  These blood tests may be repeated to confirm your diagnosis. What happens if blood glucose is too high? The pancreas produces a hormone (insulin) that helps move glucose from the bloodstream into cells. When cells in the body do not respond properly to insulin that the body makes (insulin resistance), excess glucose builds up in the blood instead of going into cells. As a result, high blood glucose (hyperglycemia) can develop, which can cause many complications. This is a symptom of prediabetes. What can happen if blood glucose stays higher than normal for a long time? Having high blood glucose for a long time is dangerous. Too much glucose in your blood can damage your nerves and blood vessels. Long-term damage can lead to complications from diabetes, which may include:  Heart disease.  Stroke.  Blindness.  Kidney disease.  Depression.  Poor circulation in the feet and legs, which could lead to surgical removal (amputation) in severe cases.  How can prediabetes be prevented from turning into type 2 diabetes?  To help prevent type 2 diabetes, take the following actions:  Be physically active. ? Do moderate-intensity physical activity for at least 30 minutes on at least 5 days of the week, or as much as told by your health care provider. This could be brisk walking, biking, or water aerobics. ? Ask your health care provider what   activities are safe for you. A mix of physical activities may be best, such as walking, swimming, cycling, and strength training.  Lose weight as told by your health care provider. ? Losing 5-7% of your body weight can reverse insulin resistance. ? Your health  care provider can determine how much weight loss is best for you and can help you lose weight safely.  Follow a healthy meal plan. This includes eating lean proteins, complex carbohydrates, fresh fruits and vegetables, low-fat dairy products, and healthy fats. ? Follow instructions from your health care provider about eating or drinking restrictions. ? Make an appointment to see a diet and nutrition specialist (registered dietitian) to help you create a healthy eating plan that is right for you.  Do not smoke or use any tobacco products, such as cigarettes, chewing tobacco, and e-cigarettes. If you need help quitting, ask your health care provider.  Take over-the-counter and prescription medicines as told by your health care provider. You may be prescribed medicines that help lower the risk of type 2 diabetes.  This information is not intended to replace advice given to you by your health care provider. Make sure you discuss any questions you have with your health care provider. Document Released: 10/09/2015 Document Revised: 11/23/2015 Document Reviewed: 08/08/2015 Elsevier Interactive Patient Education  2018 [employer]  

## 2017-09-22 ENCOUNTER — Telehealth (HOSPITAL_COMMUNITY): Payer: Self-pay | Admitting: Physician Assistant

## 2017-09-22 ENCOUNTER — Other Ambulatory Visit: Payer: Self-pay | Admitting: Physician Assistant

## 2017-09-22 DIAGNOSIS — Z1231 Encounter for screening mammogram for malignant neoplasm of breast: Secondary | ICD-10-CM

## 2017-09-22 NOTE — Telephone Encounter (Signed)
09/22/17  Noted on Facesheet she [place] Medicaid [place] of [place] and when I called patient back [facility] said that [facility] to [facility].  I called [facility] and left a messge for someone to call me before the patient's appt so we could get clarification about payment.

## 2017-09-23 ENCOUNTER — Other Ambulatory Visit: Payer: Self-pay | Admitting: Physician Assistant

## 2017-09-23 DIAGNOSIS — M25511 Pain in right shoulder: Secondary | ICD-10-CM

## 2017-09-24 ENCOUNTER — Ambulatory Visit (HOSPITAL_COMMUNITY): Payer: No Typology Code available for payment source | Attending: Physician Assistant

## 2017-09-24 ENCOUNTER — Other Ambulatory Visit: Payer: Self-pay

## 2017-09-24 ENCOUNTER — Encounter (HOSPITAL_COMMUNITY): Payer: Self-pay

## 2017-09-24 DIAGNOSIS — M25511 Pain in right shoulder: Secondary | ICD-10-CM | POA: Insufficient documentation

## 2017-09-24 DIAGNOSIS — R29898 Other symptoms and signs involving the musculoskeletal system: Secondary | ICD-10-CM | POA: Insufficient documentation

## 2017-09-24 DIAGNOSIS — M25611 Stiffness of right shoulder, not elsewhere classified: Secondary | ICD-10-CM | POA: Insufficient documentation

## 2017-09-24 NOTE — Patient Instructions (Signed)

## 2017-09-25 NOTE — Therapy (Signed)
[facility] [address] [place], [place], [address] Phone: [phone]   Fax:  [phone]  Occupational Therapy Evaluation  Patient Details  Name: Melissa NoonLashel Landstrom MRN: [id number] Date of Birth: 27-May-1956 Referring Provider: Jacquelin HawkingMcElroy, Shannon PA-C   Encounter Date: 09/24/2017  OT End of Session - 09/24/17 1711    Visit Number  1    Number of Visits  12    Date for OT Re-Evaluation  12/24/17 mini reassess: 10/22/17    Authorization Type  [facility] Charity approval. Effective 09/24/17-03/27/18    OT Start Time  1630    OT Stop Time  1715    OT Time Calculation (min)  45 min    Activity Tolerance  Patient tolerated treatment well    Behavior During Therapy  WFL for tasks assessed/performed       Past Medical History:  Diagnosis Date  . Arthritis   . Chronic back pain   . Chronic shoulder pain   . Depression   . GERD (gastroesophageal reflux disease)   . Hypertension     Past Surgical History:  Procedure Laterality Date  . FOOT FRACTURE SURGERY Left   . REPLACEMENT TOTAL KNEE BILATERAL  2011  . ROTATOR CUFF REPAIR Right   . SPINAL FUSION      There were no vitals filed for this visit.  Subjective Assessment - 09/24/17 1636    Subjective   S: I just want to be able to use this arm to do things.     Pertinent History  Pt is a 62 y/o female S/P right RTC repair on 07/24/17. Pt was receiving [place] PT in [place] for 4 weeks; 2 times a week. [place] and [place] wishing to continue her therapy. Patient has been referred patient to occupational therapy for evaluation and treatment.     Special Tests  DASH: 77.27    Patient Stated Goals  To be normal as possible with this right arm.    Currently in Pain?  Yes    Pain Score  4     Pain Location  Shoulder    Pain Orientation  Right    Pain Descriptors / Indicators  Aching;Burning    Pain Type  Acute pain    Pain Radiating Towards  radiates down arm    Pain Frequency   Intermittent    Aggravating Factors   increased movement and use    Pain Relieving Factors  ice, heat, pain medication, Biofreeze    Effect of Pain on Daily Activities  max effect. pain stops her from doing what she needs to do.     Multiple Pain Sites  No        [facility] OT Assessment - 09/24/17 1630      Assessment   Medical Diagnosis  right RTC repair    Referring Provider  Jacquelin HawkingMcElroy, Shannon PA-C    Onset Date/Surgical Date  07/24/17    Hand Dominance  Right    Next MD Visit  October 02, 2017    Prior Therapy  PT after surgery. 2x a week for 4 weeks. Was discharged due to relocation.      Precautions   Precautions  None    Precaution Comments  progress as tolerated      Restrictions   Weight Bearing Restrictions  No      Balance Screen   Has the patient fallen in the past 6 months  No  Home  Environment   Family/patient expects to be discharged to:  Private residence      Prior Function   Level of Independence  Independent    Vocation  Retired      ADL   ADL comments  Difficulty washing hair, unable to reach to shoulder level, sleeping is hard due to pain/aching, brushing teeth, reaching for clothes for closet, getting out of the tub.      Mobility   Mobility Status  Independent      Written Expression   Dominant Hand  Right      Cognition   Overall Cognitive Status  Within Functional Limits for tasks assessed      Observation/Other Assessments   Other Surveys   Select    Quick DASH   77.27      ROM / Strength   AROM / PROM / Strength  AROM;PROM;Strength      Palpation   Palpation comment  Mod fascial restrictions in right upper arm, trapezius, and scapularis region.       AROM   Overall AROM Comments  Assessed seated. IR/er adducted.    AROM Assessment Site  Shoulder    Right/Left Shoulder  Right    Right Shoulder Flexion  105 Degrees    Right Shoulder ABduction  62 Degrees    Right Shoulder Internal Rotation  90 Degrees    Right Shoulder External  Rotation  58 Degrees      PROM   Overall PROM Comments  Assessed supine. IR/er adducted.     PROM Assessment Site  Shoulder    Right/Left Shoulder  Right    Right Shoulder Flexion  150 Degrees    Right Shoulder ABduction  115 Degrees    Right Shoulder Internal Rotation  90 Degrees    Right Shoulder External Rotation  70 Degrees      Strength   Overall Strength Comments  Assessed seated. IR/er adducted    Strength Assessment Site  Shoulder    Right/Left Shoulder  Right    Right Shoulder Flexion  3-/5    Right Shoulder ABduction  3-/5    Right Shoulder Internal Rotation  4-/5    Right Shoulder External Rotation  4-/5          Quick Dash - 09/24/17 1639    Open a tight or new jar  Severe difficulty    Do heavy household chores (wash walls, wash floors)  Severe difficulty    Carry a shopping bag or briefcase  Severe difficulty    Wash your back  Unable    Use a knife to cut food  No difficulty    Recreational activities in which you take some force or impact through your arm, shoulder, or hand (golf, hammering, tennis)  Unable    During the past week, to what extent has your arm, shoulder or hand problem interfered with your normal social activities with family, friends, neighbors, or groups?  Quite a bit    During the past week, to what extent has your arm, shoulder or hand problem limited your work or other regular daily activities  Extremely    Arm, shoulder, or hand pain.  Severe    Tingling (pins and needles) in your arm, shoulder, or hand  Severe    Difficulty Sleeping  So much difficuSo much difficulty, I can't sleep    DASH Score  77.27 %  OT Education - 09/24/17 1700    Education provided  Yes    Education Details  AA/ROM shoulder exercises    Person(s) Educated  Patient    Methods  Explanation;Demonstration;Handout;Verbal cues    Comprehension  Verbalized understanding;Returned demonstration       OT Short Term Goals - 09/25/17 0808       OT SHORT TERM GOAL #1   Title  Patient will be educated and independent with HEP to faciliate progress in therapy and allow her to return to using her RUE for all daily tasks.     Time  3    Period  Weeks    Status  New    Target Date  10/16/17      OT SHORT TERM GOAL #2   Title  Patient will increase P/ROM to WNL to increase ability to get shirts on and off with less difficulty.     Time  3    Period  Weeks    Status  New      OT SHORT TERM GOAL #3   Title  Patient will increase RUE strength to 4-/5 to increase ability to complete activities at shoulder level with less difficulty.     Time  3    Period  Weeks    Status  New      OT SHORT TERM GOAL #4   Title  Patient will decrease pain level in RUE to approximately 3/10 or less when completing daily tasks.     Time  3    Period  Weeks    Status  New      OT SHORT TERM GOAL #5   Title  Patient will decrease fascial restrictions to min amount to increase functional mobility needed to fix and brush her hair using her RUE.     Time  3    Period  Weeks    Status  New        OT Long Term Goals - 09/25/17 1610      OT LONG TERM GOAL #1   Title  Patient will return to highest level of independence with daily and leisure tasks while using her right UE as dominant extremity 100% of the time.    Time  6    Period  Weeks    Status  New    Target Date  11/06/17      OT LONG TERM GOAL #2   Title  Patient will increase A/ROM to WNL to increase ability to reach overhead and out to the side with less difficulty.     Time  6    Period  Weeks    Status  New      OT LONG TERM GOAL #3   Title  Patient will increase RUE strength to 4+/5 to increase ability to complete heavy household chores and lifting tasks.     Time  6    Period  Weeks    Status  New      OT LONG TERM GOAL #4   Title  Patient will decrease RUE pain to 2/10 or less when completing daily tasks.     Time  6    Period  Weeks    Status  New             Plan - 09/25/17 0803    Clinical Impression Statement  A: Pt is a 62 y/o female S/P right RTC repair causing increased pain, fascial restrictions, and decreased strength  and ROM resulting in difficulty completing daily tasks while using her RUE as dominant.     Occupational Profile and client history currently impacting functional performance  Motivated to return to prior level of function, patient was independent prior to surgery.    Occupational performance deficits (Please refer to evaluation for details):  ADL's;IADL's;Rest and Sleep;Leisure    Rehab Potential  Excellent    Current Impairments/barriers affecting progress:  Increased pain    OT Frequency  2x / week    OT Duration  6 weeks    OT Treatment/Interventions  Self-care/ADL training;Therapeutic exercise;Manual Therapy;Ultrasound;Therapeutic activities;DME and/or AE instruction;Cryotherapy;Electrical Stimulation;Moist Heat;Passive range of motion;Patient/family education    Plan  P: Patient will benefit from skilled OT services to increase functional performance during daily tasks while using her RUE as dominant. Treatment Plan: Myofascial release, manual stretching, AA/ROM, A/ROM, general shoulder and scapular strengthening. Modalities PRN.     Clinical Decision Making  Several treatment options, min-mod task modification necessary    Consulted and Agree with Plan of Care  Patient       Patient will benefit from skilled therapeutic intervention in order to improve the following deficits and impairments:  Decreased activity tolerance, Decreased strength, Decreased range of motion, Pain, Impaired UE functional use, Increased fascial restrictions  Visit Diagnosis: Other symptoms and signs involving the musculoskeletal system - Plan: Ot plan of care cert/re-cert  Acute pain of right shoulder - Plan: Ot plan of care cert/re-cert  Stiffness of right shoulder, not elsewhere classified - Plan: Ot plan of care  cert/re-cert    Problem List Patient Active Problem List   Diagnosis Date Noted  . Essential hypertension 09/11/2017  . Chronic kidney disease 09/11/2017  . Anemia 09/11/2017  . Prediabetes 09/11/2017   Limmie Patricia, OTR/L,CBIS  [phone]  09/25/2017, 9:19 AM  [facility] [address] [place], [place], [address] Phone: [phone]   Fax:  [phone]  Name: Melissa Solis MRN: [id number] Date of Birth: 06-Aug-1955

## 2017-09-26 ENCOUNTER — Ambulatory Visit (HOSPITAL_COMMUNITY): Payer: No Typology Code available for payment source

## 2017-09-26 ENCOUNTER — Other Ambulatory Visit: Payer: Self-pay

## 2017-09-26 ENCOUNTER — Encounter (HOSPITAL_COMMUNITY): Payer: Self-pay

## 2017-09-26 DIAGNOSIS — M25611 Stiffness of right shoulder, not elsewhere classified: Secondary | ICD-10-CM

## 2017-09-26 DIAGNOSIS — M25511 Pain in right shoulder: Secondary | ICD-10-CM

## 2017-09-26 DIAGNOSIS — R29898 Other symptoms and signs involving the musculoskeletal system: Secondary | ICD-10-CM

## 2017-09-26 NOTE — Patient Instructions (Signed)
Repeat all exercises 10-15 times, 1-2 times per day.  1) Shoulder Protraction    Begin with elbows by your side, slowly "punch" straight out in front of you.      2) Shoulder Flexion  Standing:         Begin with arms at your side with thumbs pointed up, slowly raise both arms up and forward towards overhead.      3) Horizontal abduction/adduction  Horizontal Abduction/Adduction      Straight arms holding cane at shoulder height, bring cane to right, center, left. Repeat starting to left.    4) Internal & External Rotation    *No band* -Stand with elbows at the side and elbows bent 90 degrees. Move your forearms away from your body, then bring back inward toward the body.     5) Shoulder Abduction  Standing:       Lying on your back begin with your arms flat on the table next to your side. Slowly move your arms out to the side so that they go overhead, in a jumping jack or snow angel movement.

## 2017-09-26 NOTE — Therapy (Signed)
[facility] [address] [place], [place], [address] Phone: [phone]   Fax:  [phone]  Occupational Therapy Treatment  Patient Details  Name: Melissa NoonLashel Stodghill MRN: [id number] Date of Birth: May 08, 1956 Referring Provider: Jacquelin HawkingMcElroy, Shannon PA-C   Encounter Date: 09/26/2017  OT End of Session - 09/26/17 1112    Visit Number  2    Number of Visits  12    Date for OT Re-Evaluation  12/24/17 mini reassess: 10/22/17    Authorization Type  [facility] Charity approval. Effective 09/24/17-03/27/18    OT Start Time  1040    OT Stop Time  1115    OT Time Calculation (min)  35 min    Activity Tolerance  Patient tolerated treatment well    Behavior During Therapy  WFL for tasks assessed/performed       Past Medical History:  Diagnosis Date  . Arthritis   . Chronic back pain   . Chronic shoulder pain   . Depression   . GERD (gastroesophageal reflux disease)   . Hypertension     Past Surgical History:  Procedure Laterality Date  . FOOT FRACTURE SURGERY Left   . REPLACEMENT TOTAL KNEE BILATERAL  2011  . ROTATOR CUFF REPAIR Right   . SPINAL FUSION      There were no vitals filed for this visit.  Subjective Assessment - 09/26/17 1101    Subjective   S: I was definitely more sore the other day.    Currently in Pain?  Yes    Pain Score  3     Pain Location  Shoulder    Pain Descriptors / Indicators  Aching;Burning    Pain Type  Acute pain         [facility] OT Assessment - 09/26/17 1105      Assessment   Medical Diagnosis  right RTC repair      Precautions   Precautions  None    Precaution Comments  progress as tolerated               OT Treatments/Exercises (OP) - 09/26/17 1107      Exercises   Exercises  Shoulder      Shoulder Exercises: Supine   Protraction  PROM;AROM;10 reps    Horizontal ABduction  PROM;AROM;10 reps    External Rotation  PROM;AROM;10 reps    Internal Rotation  PROM;AROM;10 reps    Flexion   PROM;AROM;10 reps    ABduction  PROM;AROM;10 reps      Shoulder Exercises: Standing   Protraction  AROM;10 reps    Horizontal ABduction  AAROM;10 reps    External Rotation  AROM;10 reps    Internal Rotation  AROM;10 reps    Flexion  AROM;10 reps    ABduction  AROM;10 reps      Shoulder Exercises: Pulleys   Flexion  1 minute    ABduction  1 minute      Shoulder Exercises: ROM/Strengthening   Wall Wash  1'             OT Education - 09/26/17 1112    Education provided  Yes    Education Details  A/ROM shoulder exercises continue with AA/ROM horizontal abduction    Person(s) Educated  Patient    Methods  Explanation;Demonstration;Verbal cues;Handout    Comprehension  Returned demonstration;Verbalized understanding       OT Short Term Goals - 09/26/17 1103      OT SHORT TERM GOAL #1   Title  Patient will be educated and independent with HEP to faciliate progress in therapy and allow her to return to using her RUE for all daily tasks.     Time  3    Period  Weeks    Status  On-going      OT SHORT TERM GOAL #2   Title  Patient will increase P/ROM to WNL to increase ability to get shirts on and off with less difficulty.     Time  3    Period  Weeks    Status  On-going      OT SHORT TERM GOAL #3   Title  Patient will increase RUE strength to 4-/5 to increase ability to complete activities at shoulder level with less difficulty.     Time  3    Period  Weeks    Status  On-going      OT SHORT TERM GOAL #4   Title  Patient will decrease pain level in RUE to approximately 3/10 or less when completing daily tasks.     Time  3    Period  Weeks    Status  On-going      OT SHORT TERM GOAL #5   Title  Patient will decrease fascial restrictions to min amount to increase functional mobility needed to fix and brush her hair using her RUE.     Time  3    Period  Weeks    Status  On-going        OT Long Term Goals - 09/26/17 1103      OT LONG TERM GOAL #1   Title   Patient will return to highest level of independence with daily and leisure tasks while using her right UE as dominant extremity 100% of the time.    Time  6    Period  Weeks    Status  On-going      OT LONG TERM GOAL #2   Title  Patient will increase A/ROM to WNL to increase ability to reach overhead and out to the side with less difficulty.     Time  6    Period  Weeks    Status  On-going      OT LONG TERM GOAL #3   Title  Patient will increase RUE strength to 4+/5 to increase ability to complete heavy household chores and lifting tasks.     Time  6    Period  Weeks    Status  On-going      OT LONG TERM GOAL #4   Title  Patient will decrease RUE pain to 2/10 or less when completing daily tasks.     Time  6    Period  Weeks    Status  On-going            Plan - 09/26/17 1159    Clinical Impression Statement  A: Initiated myofascial release, manual stretching, A/ROM, AA/ROM, and wall wash. Updated HEP to continue to allow patient to progress towards therapy goals. VC for form and technique as needed.   (Pended)     Plan  P: Add thumb tacks, scapular theraband strengthening.  (Pended)     Consulted and Agree with Plan of Care  Patient  (Pended)        Patient will benefit from skilled therapeutic intervention in order to improve the following deficits and impairments:  (P) Decreased activity tolerance, Decreased strength, Decreased range of motion, Pain, Impaired UE functional use, Increased fascial  restrictions  Visit Diagnosis: Other symptoms and signs involving the musculoskeletal system  Acute pain of right shoulder  Stiffness of right shoulder, not elsewhere classified    Problem List Patient Active Problem List   Diagnosis Date Noted  . Essential hypertension 09/11/2017  . Chronic kidney disease 09/11/2017  . Anemia 09/11/2017  . Prediabetes 09/11/2017   Melissa Solis, OTR/L,CBIS  [phone]  09/26/2017, 12:20 PM  [facility] [address] [place], [place], [address] Phone: [phone]   Fax:  [phone]  Name: Melissa Solis MRN: [id number] Date of Birth: 30-Sep-1955

## 2017-09-29 ENCOUNTER — Encounter (HOSPITAL_COMMUNITY): Payer: Self-pay

## 2017-09-29 ENCOUNTER — Encounter: Payer: Self-pay | Admitting: Physician Assistant

## 2017-09-29 ENCOUNTER — Other Ambulatory Visit: Payer: Self-pay

## 2017-09-29 ENCOUNTER — Ambulatory Visit (HOSPITAL_COMMUNITY): Payer: No Typology Code available for payment source | Attending: Physician Assistant

## 2017-09-29 DIAGNOSIS — M25611 Stiffness of right shoulder, not elsewhere classified: Secondary | ICD-10-CM | POA: Insufficient documentation

## 2017-09-29 DIAGNOSIS — R29898 Other symptoms and signs involving the musculoskeletal system: Secondary | ICD-10-CM

## 2017-09-29 DIAGNOSIS — M25511 Pain in right shoulder: Secondary | ICD-10-CM | POA: Insufficient documentation

## 2017-09-29 NOTE — Therapy (Signed)
[facility] [address] [place], [place], [address] Phone: [phone]   Fax:  [phone]  Occupational Therapy Treatment  Patient Details  Name: Melissa Solis MRN: [id number] Date of Birth: 16-Jun-1956 Referring Provider: Jacquelin Hawking PA-C   Encounter Date: 09/29/2017  OT End of Session - 09/29/17 1538    Visit Number  3    Number of Visits  12    Date for OT Re-Evaluation  12/24/17 mini reassess: 10/22/17    Authorization Type  [facility] Charity approval. Effective 09/24/17-03/27/18    OT Start Time  1518    OT Stop Time  1556    OT Time Calculation (min)  38 min    Activity Tolerance  Patient tolerated treatment well    Behavior During Therapy  WFL for tasks assessed/performed       Past Medical History:  Diagnosis Date  . Arthritis   . Chronic back pain   . Chronic shoulder pain   . Depression   . GERD (gastroesophageal reflux disease)   . Hypertension     Past Surgical History:  Procedure Laterality Date  . FOOT FRACTURE SURGERY Left   . REPLACEMENT TOTAL KNEE BILATERAL  2011  . ROTATOR CUFF REPAIR Right   . SPINAL FUSION      There were no vitals filed for this visit.  Subjective Assessment - 09/29/17 1534    Subjective   S: It aches so much at night.    Currently in Pain?  Yes    Pain Score  4     Pain Location  Shoulder    Pain Orientation  Right    Pain Descriptors / Indicators  Sore;Aching    Pain Type  Acute pain         [facility] OT Assessment - 09/29/17 1536      Assessment   Medical Diagnosis  right RTC repair      Precautions   Precautions  None    Precaution Comments  progress as tolerated               OT Treatments/Exercises (OP) - 09/29/17 1536      Exercises   Exercises  Shoulder      Shoulder Exercises: Supine   Protraction  PROM;5 reps;AROM;12 reps    Horizontal ABduction  PROM;5 reps;AROM;15 reps    External Rotation  PROM;5 reps;AROM;12 reps    Internal Rotation  PROM;5  reps;AROM;12 reps    Flexion  PROM;5 reps;AROM;12 reps    ABduction  PROM;5 reps;AROM;12 reps      Shoulder Exercises: Standing   Protraction  AROM;12 reps    Horizontal ABduction  AROM;AAROM;5 reps    External Rotation  AROM;12 reps    Internal Rotation  AROM;12 reps    Flexion  AROM;12 reps    ABduction  AROM;12 reps    Extension  Theraband;10 reps    Theraband Level (Shoulder Extension)  Level 2 (Red)    Row  Theraband;10 reps    Theraband Level (Shoulder Row)  Level 2 (Red)    Retraction  Theraband;10 reps    Theraband Level (Shoulder Retraction)  Level 2 (Red)      Shoulder Exercises: ROM/Strengthening   Wall Wash  1'    Thumb Tacks  1'      Manual Therapy   Manual Therapy  Myofascial release    Manual therapy comments  Manual therapy completed prior to exercises.     Myofascial Release  Myofascial release  and manual stretching completed to right upper arm, trapezius, and scapularis region.                OT Short Term Goals - 09/26/17 1103      OT SHORT TERM GOAL #1   Title  Patient will be educated and independent with HEP to faciliate progress in therapy and allow her to return to using her RUE for all daily tasks.     Time  3    Period  Weeks    Status  On-going      OT SHORT TERM GOAL #2   Title  Patient will increase P/ROM to WNL to increase ability to get shirts on and off with less difficulty.     Time  3    Period  Weeks    Status  On-going      OT SHORT TERM GOAL #3   Title  Patient will increase RUE strength to 4-/5 to increase ability to complete activities at shoulder level with less difficulty.     Time  3    Period  Weeks    Status  On-going      OT SHORT TERM GOAL #4   Title  Patient will decrease pain level in RUE to approximately 3/10 or less when completing daily tasks.     Time  3    Period  Weeks    Status  On-going      OT SHORT TERM GOAL #5   Title  Patient will decrease fascial restrictions to min amount to increase  functional mobility needed to fix and brush her hair using her RUE.     Time  3    Period  Weeks    Status  On-going        OT Long Term Goals - 09/26/17 1103      OT LONG TERM GOAL #1   Title  Patient will return to highest level of independence with daily and leisure tasks while using her right UE as dominant extremity 100% of the time.    Time  6    Period  Weeks    Status  On-going      OT LONG TERM GOAL #2   Title  Patient will increase A/ROM to WNL to increase ability to reach overhead and out to the side with less difficulty.     Time  6    Period  Weeks    Status  On-going      OT LONG TERM GOAL #3   Title  Patient will increase RUE strength to 4+/5 to increase ability to complete heavy household chores and lifting tasks.     Time  6    Period  Weeks    Status  On-going      OT LONG TERM GOAL #4   Title  Patient will decrease RUE pain to 2/10 or less when completing daily tasks.     Time  6    Period  Weeks    Status  On-going            Plan - 09/29/17 1553    Clinical Impression Statement  A: Increase A/ROM repetitions to 12 this session. patient continues to struggle with standing horizontal abduction without use of dowel rod. VC for form and technique when completing exercises. Added scapular theraband for scapular strength and stability.     Plan  P: Add table top wash to increase strength for horizontal abduction. Add sidelying  A/ROM if able to tolerate with good form.    Consulted and Agree with Plan of Care  Patient       Patient will benefit from skilled therapeutic intervention in order to improve the following deficits and impairments:  Decreased activity tolerance, Decreased strength, Decreased range of motion, Pain, Impaired UE functional use, Increased fascial restrictions  Visit Diagnosis: Other symptoms and signs involving the musculoskeletal system  Acute pain of right shoulder  Stiffness of right shoulder, not elsewhere  classified    Problem List Patient Active Problem List   Diagnosis Date Noted  . Essential hypertension 09/11/2017  . Chronic kidney disease 09/11/2017  . Anemia 09/11/2017  . Prediabetes 09/11/2017   Limmie PatriciaLaura Dejanira Pamintuan, OTR/L,CBIS  [phone]  09/29/2017, 3:56 PM  [facility] [address] [place], [place], [address] Phone: [phone]   Fax:  [phone]  Name: Melissa Solis MRN: [id number] Date of Birth: 12-Feb-1956

## 2017-10-01 ENCOUNTER — Ambulatory Visit (HOSPITAL_COMMUNITY): Payer: No Typology Code available for payment source

## 2017-10-01 DIAGNOSIS — M25611 Stiffness of right shoulder, not elsewhere classified: Secondary | ICD-10-CM

## 2017-10-01 DIAGNOSIS — M25511 Pain in right shoulder: Secondary | ICD-10-CM

## 2017-10-01 DIAGNOSIS — R29898 Other symptoms and signs involving the musculoskeletal system: Secondary | ICD-10-CM

## 2017-10-01 NOTE — Congregational Nurse Program (Signed)
Congregational Nurse Program Note  Date of Encounter: 10/01/2017  Past Medical History: Past Medical History:  Diagnosis Date  . Arthritis   . Chronic back pain   . Chronic shoulder pain   . Depression   . GERD (gastroesophageal reflux disease)   . Hypertension     Encounter Details: CNP Questionnaire - 10/01/17 1036      Questionnaire   Patient Status  Not Applicable    Race  Black or African American    [facility] [place]    Insurance  Not Applicable    Uninsured  Uninsured (NEW 1x/quarter)    Food  No food insecurities    Housing/Utilities  Yes, have permanent housing    Transportation  No transportation needs    Interpersonal Safety  Yes, feel physically and emotionally safe where you currently live    Medication  No medication insecurities    Medical Provider  Yes    Referrals  Not Applicable    ED Visit Averted  Not Applicable    Life-Saving Intervention Made  Not Applicable      [facility]125/[facility], P 120. [facility]. Also c/o SOB at times.,  Jenene SlickerEmma Jaedynn Bohlken RN, [place] [facility] [phone]

## 2017-10-01 NOTE — Therapy (Signed)
[facility] [address] [place], [place], [address] Phone: [phone]   Fax:  [phone]  Occupational Therapy Treatment  Patient Details  Name: Melissa Solis MRN: [id number] Date of Birth: 1955/12/26 Referring Provider: Jacquelin Hawking PA-C   Encounter Date: 10/01/2017  OT End of Session - 10/01/17 1157    Visit Number  4    Number of Visits  12    Date for OT Re-Evaluation  12/24/17 mini reassess: 10/22/17    Authorization Type  [facility] Charity approval. Effective 09/24/17-03/27/18    OT Start Time  1125 Pt arrived late    OT Stop Time  1200    OT Time Calculation (min)  35 min    Activity Tolerance  Patient tolerated treatment well    Behavior During Therapy  [facility] for tasks assessed/performed       Past Medical History:  Diagnosis Date  . Arthritis   . Chronic back pain   . Chronic shoulder pain   . Depression   . GERD (gastroesophageal reflux disease)   . Hypertension     Past Surgical History:  Procedure Laterality Date  . FOOT FRACTURE SURGERY Left   . REPLACEMENT TOTAL KNEE BILATERAL  2011  . ROTATOR CUFF REPAIR Right   . SPINAL FUSION      There were no vitals filed for this visit.      [facility] OT Assessment - 10/01/17 1152      Assessment   Medical Diagnosis  right RTC repair      Precautions   Precautions  None    Precaution Comments  progress as tolerated               OT Treatments/Exercises (OP) - 10/01/17 1152      Exercises   Exercises  Shoulder      Shoulder Exercises: Supine   Protraction  PROM;5 reps;AROM;15 reps    Horizontal ABduction  PROM;5 reps;AROM;15 reps    External Rotation  PROM;5 reps;AROM;15 reps    Internal Rotation  PROM;5 reps;AROM;15 reps    Flexion  PROM;5 reps;AROM;15 reps    ABduction  PROM;5 reps;AROM;15 reps      Shoulder Exercises: ROM/Strengthening   UBE (Upper Arm Bike)  Level 1 2' forward 2' reverse pace: 4.0-4.5    Wall Wash  1'    Other  ROM/Strengthening Exercises  PVC pipe slides; flexion/abduction 12X      Manual Therapy   Manual Therapy  Myofascial release    Manual therapy comments  Manual therapy completed prior to exercises.     Myofascial Release  Myofascial release and manual stretching completed to right upper arm, trapezius, and scapularis region.                OT Short Term Goals - 09/26/17 1103      OT SHORT TERM GOAL #1   Title  Patient will be educated and independent with HEP to faciliate progress in therapy and allow her to return to using her RUE for all daily tasks.     Time  3    Period  Weeks    Status  On-going      OT SHORT TERM GOAL #2   Title  Patient will increase P/ROM to WNL to increase ability to get shirts on and off with less difficulty.     Time  3    Period  Weeks    Status  On-going      OT SHORT  TERM GOAL #3   Title  Patient will increase RUE strength to 4-/5 to increase ability to complete activities at shoulder level with less difficulty.     Time  3    Period  Weeks    Status  On-going      OT SHORT TERM GOAL #4   Title  Patient will decrease pain level in RUE to approximately 3/10 or less when completing daily tasks.     Time  3    Period  Weeks    Status  On-going      OT SHORT TERM GOAL #5   Title  Patient will decrease fascial restrictions to min amount to increase functional mobility needed to fix and brush her hair using her RUE.     Time  3    Period  Weeks    Status  On-going        OT Long Term Goals - 09/26/17 1103      OT LONG TERM GOAL #1   Title  Patient will return to highest level of independence with daily and leisure tasks while using her right UE as dominant extremity 100% of the time.    Time  6    Period  Weeks    Status  On-going      OT LONG TERM GOAL #2   Title  Patient will increase A/ROM to WNL to increase ability to reach overhead and out to the side with less difficulty.     Time  6    Period  Weeks    Status  On-going       OT LONG TERM GOAL #3   Title  Patient will increase RUE strength to 4+/5 to increase ability to complete heavy household chores and lifting tasks.     Time  6    Period  Weeks    Status  On-going      OT LONG TERM GOAL #4   Title  Patient will decrease RUE pain to 2/10 or less when completing daily tasks.     Time  6    Period  Weeks    Status  On-going            Plan - 10/01/17 1157    Clinical Impression Statement  A: Completed A/ROM supine with patient able to tolerate an increase in repetitions. Patient continues to have difficulty with standing Horziontal abduction. During manual therapy it was noted to be a trigger point with muscle twitching in her anterior deltoid. Pt did respond well to focused manual therapy on anterior deltoid with decreased muscle twitching. Added PVC pipe slide and UBE bike this session. patient was able to complete wall wash at a higher level of range than previous sessions. VC were needed for form and technique.    Plan  P: Add sidelying A/ROM if able to tolerate with good form. Focus on increasing ability to complete horzontal abduction standing with good form.     Consulted and Agree with Plan of Care  Patient       Patient will benefit from skilled therapeutic intervention in order to improve the following deficits and impairments:  Decreased activity tolerance, Decreased strength, Decreased range of motion, Pain, Impaired UE functional use, Increased fascial restrictions  Visit Diagnosis: Other symptoms and signs involving the musculoskeletal system  Acute pain of right shoulder  Stiffness of right shoulder, not elsewhere classified    Problem List Patient Active Problem List   Diagnosis Date Noted  .  Essential hypertension 09/11/2017  . Chronic kidney disease 09/11/2017  . Anemia 09/11/2017  . Prediabetes 09/11/2017   Limmie PatriciaLaura Devika Dragovich, OTR/L,CBIS  [phone]  10/01/2017, 12:31 PM  [facility] [address] [place], [place], [address] Phone: [phone]   Fax:  [phone]  Name: Chesley NoonLashel Baskins MRN: [id number] Date of Birth: 18-May-1956

## 2017-10-02 ENCOUNTER — Encounter: Payer: Self-pay | Admitting: Physician Assistant

## 2017-10-02 ENCOUNTER — Other Ambulatory Visit: Payer: Self-pay | Admitting: Physician Assistant

## 2017-10-02 ENCOUNTER — Ambulatory Visit: Payer: Medicaid - Out of State | Admitting: Physician Assistant

## 2017-10-02 VITALS — BP 145/93 | HR 109 | Temp 97.3°F | Ht 65.75 in | Wt 219.5 lb

## 2017-10-02 DIAGNOSIS — Z1211 Encounter for screening for malignant neoplasm of colon: Secondary | ICD-10-CM

## 2017-10-02 DIAGNOSIS — N189 Chronic kidney disease, unspecified: Secondary | ICD-10-CM

## 2017-10-02 DIAGNOSIS — M48061 Spinal stenosis, lumbar region without neurogenic claudication: Secondary | ICD-10-CM

## 2017-10-02 DIAGNOSIS — M25511 Pain in right shoulder: Secondary | ICD-10-CM

## 2017-10-02 DIAGNOSIS — I10 Essential (primary) hypertension: Secondary | ICD-10-CM

## 2017-10-02 DIAGNOSIS — D649 Anemia, unspecified: Secondary | ICD-10-CM

## 2017-10-02 NOTE — Progress Notes (Signed)
BP (!) 145/93 (BP Location: Left Arm, Patient Position: Sitting, Cuff Size: Normal)   Pulse (!) 109   Temp (!) 97.3 F (36.3 C)   Ht 5' 5.75" (1.67 m)   Wt 219 lb 8 oz (99.6 kg)   SpO2 99%   BMI 35.70 kg/m    Subjective:    Patient ID: Melissa Solis, female    DOB: [birth year], 62 y.o.   MRN: [id number]  HPI: Melissa Solis is a 62 y.o. female presenting on 10/02/2017 for Follow-up   HPI Pt says she has not gotten meds from [facility] but she got a call and said it should be any day now.   Her dizziness is better  She is still doing PT for her shoulder  Pt states last PAP was last year.   Relevant past medical, surgical, family and social history reviewed and updated as indicated. Interim medical history since our last visit reviewed. Allergies and medications reviewed and updated.   Current Outpatient Medications:  .  baclofen (LIORESAL) 10 MG tablet, Take 1 tablet (10 mg total) by mouth 3 (three) times daily. (Patient taking differently: Take 10 mg by mouth 2 (two) times daily as needed for muscle spasms (pain). ), Disp: 30 each, Rfl: 0 .  cetirizine (ZYRTEC) 10 MG tablet, Take 10 mg by mouth daily., Disp: , Rfl:  .  Cholecalciferol (VITAMIN D3) 2000 units TABS, Take 1 tablet by mouth daily., Disp: , Rfl:  .  DOCUSATE SODIUM PO, Take 1 tablet by mouth as needed., Disp: , Rfl:  .  hydrochlorothiazide (MICROZIDE) 12.5 MG capsule, Take 1 capsule (12.5 mg total) by mouth daily., Disp: 30 capsule, Rfl: 0 .  HYDROcodone-acetaminophen (NORCO/VICODIN) 5-325 MG tablet, Take 1 tablet by mouth every 6 (six) hours as needed for moderate pain., Disp: , Rfl:  .  irbesartan (AVAPRO) 150 MG tablet, Take 1 tablet (150 mg total) by mouth daily., Disp: 30 tablet, Rfl: 0 .  nabumetone (RELAFEN) 500 MG tablet, Take 500 mg by mouth 2 (two) times daily as needed for moderate pain. , Disp: , Rfl:  .  Iron Combinations (IRON COMPLEX) CAPS, daily (Patient not taking: Reported on 10/02/2017), Disp: ,  Rfl:  .  metoprolol tartrate (LOPRESSOR) 100 MG tablet, Take 1 tablet (100 mg total) by mouth 2 (two) times daily. (Patient not taking: Reported on 10/02/2017), Disp: 180 tablet, Rfl: 1 .  omeprazole (PRILOSEC) 40 MG capsule, Take 1 capsule (40 mg total) by mouth daily. (Patient not taking: Reported on 10/02/2017), Disp: 90 capsule, Rfl: 1 .  valsartan (DIOVAN) 160 MG tablet, Take 160 mg by mouth daily., Disp: , Rfl:   Review of Systems  Constitutional: Positive for appetite change, diaphoresis, fatigue and unexpected weight change. Negative for chills and fever.  HENT: Positive for ear pain. Negative for congestion, dental problem, drooling, facial swelling, hearing loss, mouth sores, sneezing, sore throat, trouble swallowing and voice change.   Eyes: Positive for redness and visual disturbance. Negative for pain, discharge and itching.  Respiratory: Positive for wheezing. Negative for cough, choking and shortness of breath.   Cardiovascular: Positive for leg swelling. Negative for chest pain and palpitations.  Gastrointestinal: Negative for abdominal pain, blood in stool, constipation, diarrhea and vomiting.  Endocrine: Negative for cold intolerance, heat intolerance and polydipsia.  Genitourinary: Positive for decreased urine volume. Negative for dysuria and hematuria.  Musculoskeletal: Positive for arthralgias, back pain and gait problem.  Skin: Negative for rash.  Allergic/Immunologic: Positive for environmental allergies.  Neurological: Negative  for seizures, syncope, light-headedness and headaches.  Hematological: Negative for adenopathy.  Psychiatric/Behavioral: Negative for agitation, dysphoric mood and suicidal ideas. The patient is not nervous/anxious.     Per HPI unless specifically indicated above     Objective:    BP (!) 145/93 (BP Location: Left Arm, Patient Position: Sitting, Cuff Size: Normal)   Pulse (!) 109   Temp (!) 97.3 F (36.3 C)   Ht 5' 5.75" (1.67 m)   Wt 219 lb  8 oz (99.6 kg)   SpO2 99%   BMI 35.70 kg/m   Wt Readings from Last 3 Encounters:  10/02/17 219 lb 8 oz (99.6 kg)  09/11/17 216 lb 8 oz (98.2 kg)  09/06/17 218 lb (98.9 kg)    Physical Exam  Constitutional: She is oriented to person, place, and time. She appears well-developed and well-nourished.  HENT:  Head: Normocephalic and atraumatic.  Neck: Neck supple.  Cardiovascular: Normal rate and regular rhythm.  Pulmonary/Chest: Effort normal and breath sounds normal.  Abdominal: Soft. Bowel sounds are normal. She exhibits no mass. There is no hepatosplenomegaly. There is no tenderness.  Musculoskeletal: She exhibits no edema.  Lymphadenopathy:    She has no cervical adenopathy.  Neurological: She is alert and oriented to person, place, and time.  Skin: Skin is warm and dry.  Psychiatric: She has a normal mood and affect. Her behavior is normal.  Vitals reviewed.       Assessment & Plan:   Encounter Diagnoses  Name Primary?  . Essential hypertension Yes  . Anemia, unspecified type   . Right shoulder pain, unspecified chronicity   . Screening for colon cancer   . Chronic kidney disease, unspecified CKD stage   . Spinal stenosis of lumbar region, unspecified whether neurogenic claudication present       -pt reminded to Get on iron supplement -pt was given iFOBT for colon cancer screening -Record request sent for last PAP -counseled pt on healthy diet and nuturition -pt was given phone number to contact financial counselor about [facility] -she can call back the orthopedist when she gets the charity care in place (for shoulder and back) -pt given phone number to call back and schedule her mammogram -pt should be getting meds from [facility] any day now -pt to follow up 6 weeks to recheck bp.  RTO sooner prn

## 2017-10-02 NOTE — Patient Instructions (Addendum)
[employer] [facility] [phone]  [facility] Customer Service [phone]  Jeani Hawking Financial Counselor [phone]  ---------------------------------------------------  Coming from [facility]:  Valsartan  (bp) Hydrochlorothiazide   (bp) Omeprazole  (stomach) Metoprolol  (bp)

## 2017-10-06 ENCOUNTER — Ambulatory Visit (HOSPITAL_COMMUNITY): Payer: No Typology Code available for payment source | Admitting: Specialist

## 2017-10-06 ENCOUNTER — Encounter (HOSPITAL_COMMUNITY): Payer: Self-pay | Admitting: Specialist

## 2017-10-06 DIAGNOSIS — M25511 Pain in right shoulder: Secondary | ICD-10-CM

## 2017-10-06 DIAGNOSIS — M25611 Stiffness of right shoulder, not elsewhere classified: Secondary | ICD-10-CM

## 2017-10-06 DIAGNOSIS — R29898 Other symptoms and signs involving the musculoskeletal system: Secondary | ICD-10-CM

## 2017-10-06 NOTE — Therapy (Signed)
[facility] [address] [place], [place], [address] Phone: [phone]   Fax:  [phone]  Occupational Therapy Treatment  Patient Details  Name: Melissa NoonLashel Solis MRN: [id number] Date of Birth: Sep 04, 1955 Referring Provider: Jacquelin HawkingMcElroy, Shannon PA-C   Encounter Date: 10/06/2017  OT End of Session - 10/06/17 1626    Visit Number  5    Number of Visits  12    Date for OT Re-Evaluation  12/24/17 mini reassess on 10/22/17    Authorization Type  [facility] approval. Effective 09/24/17-03/27/18    OT Start Time  1520    OT Stop Time  1600    OT Time Calculation (min)  40 min    Activity Tolerance  Patient tolerated treatment well       Past Medical History:  Diagnosis Date  . Arthritis   . Chronic back pain   . Chronic shoulder pain   . Depression   . GERD (gastroesophageal reflux disease)   . Hypertension     Past Surgical History:  Procedure Laterality Date  . FOOT FRACTURE SURGERY Left   . REPLACEMENT TOTAL KNEE BILATERAL  2011  . ROTATOR CUFF REPAIR Right   . SPINAL FUSION      There were no vitals filed for this visit.  Subjective Assessment - 10/06/17 1625    Subjective   S:  I know its getting better.  I can reach into an overhead cabinet now.      Currently in Pain?  Yes    Pain Score  3     Pain Location  Shoulder    Pain Orientation  Right    Pain Descriptors / Indicators  Sore;Aching    Pain Type  Acute pain         [facility] OT Assessment - 10/06/17 0001      Assessment   Medical Diagnosis  right RTC repair      Precautions   Precautions  None    Precaution Comments  progress as tolerated               OT Treatments/Exercises (OP) - 10/06/17 0001      Exercises   Exercises  Shoulder      Shoulder Exercises: Supine   Protraction  PROM;5 reps    Horizontal ABduction  PROM;5 reps    External Rotation  PROM;5 reps    Internal Rotation  PROM;5 reps    Flexion  PROM;5 reps    ABduction  PROM;5  reps      Shoulder Exercises: Standing   Protraction  AROM;12 reps    Horizontal ABduction  AAROM;12 reps with therapist providing mod pa    External Rotation  AROM;12 reps    Internal Rotation  AROM;12 reps    Flexion  AROM;12 reps to 90 with vg to decrease elevation    ABduction  AROM;12 reps    ABduction Limitations  to 90 with mod vg to decrease elevation      Shoulder Exercises: ROM/Strengthening   UBE (Upper Arm Bike)  Level 1 2' forward 2' reverse at 4.5 speed    Wall Wash  1' at 90 to 180 flexion    Other ROM/Strengthening Exercises  PVC pipe slides; flexion/abduction 15X    Other ROM/Strengthening Exercises  overhead pinch tree, able to place and remove all clothespins reaching top of vertical pole with moderate increase in pain with overhead reaching       Manual Therapy   Manual Therapy  Myofascial release    Manual therapy comments  Manual therapy completed prior to exercises.     Myofascial Release  Myofascial release and manual stretching completed to right upper arm, trapezius, and scapularis region to decrease pain and restrictions that are prohibiting end range mobiilty.                 OT Short Term Goals - 09/26/17 1103      OT SHORT TERM GOAL #1   Title  Patient will be educated and independent with HEP to faciliate progress in therapy and allow her to return to using her RUE for all daily tasks.     Time  3    Period  Weeks    Status  On-going      OT SHORT TERM GOAL #2   Title  Patient will increase P/ROM to WNL to increase ability to get shirts on and off with less difficulty.     Time  3    Period  Weeks    Status  On-going      OT SHORT TERM GOAL #3   Title  Patient will increase RUE strength to 4-/5 to increase ability to complete activities at shoulder level with less difficulty.     Time  3    Period  Weeks    Status  On-going      OT SHORT TERM GOAL #4   Title  Patient will decrease pain level in RUE to approximately 3/10 or less when  completing daily tasks.     Time  3    Period  Weeks    Status  On-going      OT SHORT TERM GOAL #5   Title  Patient will decrease fascial restrictions to min amount to increase functional mobility needed to fix and brush her hair using her RUE.     Time  3    Period  Weeks    Status  On-going        OT Long Term Goals - 09/26/17 1103      OT LONG TERM GOAL #1   Title  Patient will return to highest level of independence with daily and leisure tasks while using her right UE as dominant extremity 100% of the time.    Time  6    Period  Weeks    Status  On-going      OT LONG TERM GOAL #2   Title  Patient will increase A/ROM to WNL to increase ability to reach overhead and out to the side with less difficulty.     Time  6    Period  Weeks    Status  On-going      OT LONG TERM GOAL #3   Title  Patient will increase RUE strength to 4+/5 to increase ability to complete heavy household chores and lifting tasks.     Time  6    Period  Weeks    Status  On-going      OT LONG TERM GOAL #4   Title  Patient will decrease RUE pain to 2/10 or less when completing daily tasks.     Time  6    Period  Weeks    Status  On-going            Plan - 10/06/17 1627    Clinical Impression Statement  A:  able to complete A/ROM seated through 50% range in flexion and abduction due to weakness in right shoulder.  added sidelying a/rom and patient able to complete through 75% range.  Manual therapy completed to decrease restrictions in scapular muscles that are causing increased pain when reaching to shoulder height or above.     Plan  P:  Continue to improve independence with overhead reaching as strength improved and pain decreases. Add rythmic stabilization in seated with shoulder at 90 degrees flexion and abduction.        Patient will benefit from skilled therapeutic intervention in order to improve the following deficits and impairments:  Decreased activity tolerance, Decreased strength,  Decreased range of motion, Pain, Impaired UE functional use, Increased fascial restrictions  Visit Diagnosis: Other symptoms and signs involving the musculoskeletal system  Acute pain of right shoulder  Stiffness of right shoulder, not elsewhere classified    Problem List Patient Active Problem List   Diagnosis Date Noted  . Essential hypertension 09/11/2017  . Chronic kidney disease 09/11/2017  . Anemia 09/11/2017  . Prediabetes 09/11/2017    Shirlean Mylar, MHA, OTR/L [phone]  10/06/2017, 4:34 PM  [facility] [address] [place], [place], [address] Phone: [phone]   Fax:  [phone]  Name: Melissa Solis MRN: [id number] Date of Birth: 02-06-1956

## 2017-10-07 LAB — IFOBT (OCCULT BLOOD): IMMUNOLOGICAL FECAL OCCULT BLOOD TEST: NEGATIVE

## 2017-10-08 ENCOUNTER — Encounter (HOSPITAL_COMMUNITY): Payer: Self-pay | Admitting: Occupational Therapy

## 2017-10-08 ENCOUNTER — Other Ambulatory Visit: Payer: Self-pay | Admitting: Physician Assistant

## 2017-10-08 ENCOUNTER — Ambulatory Visit (HOSPITAL_COMMUNITY): Payer: No Typology Code available for payment source | Admitting: Occupational Therapy

## 2017-10-08 DIAGNOSIS — M25611 Stiffness of right shoulder, not elsewhere classified: Secondary | ICD-10-CM

## 2017-10-08 DIAGNOSIS — R29898 Other symptoms and signs involving the musculoskeletal system: Secondary | ICD-10-CM

## 2017-10-08 DIAGNOSIS — M25511 Pain in right shoulder: Secondary | ICD-10-CM

## 2017-10-08 MED ORDER — HYDROCHLOROTHIAZIDE 12.5 MG PO CAPS: 12.5000 mg | ORAL_CAPSULE | Freq: Every day | ORAL | 1 refills | Status: DC

## 2017-10-08 MED ORDER — IRBESARTAN 150 MG PO TABS: 150.0000 mg | ORAL_TABLET | Freq: Every day | ORAL | 1 refills | Status: DC

## 2017-10-08 NOTE — Therapy (Signed)
[facility] [address] [place], [place], [address] Phone: [phone]   Fax:  [phone]  Occupational Therapy Treatment  Patient Details  Name: Melissa Solis MRN: [id number] Date of Birth: 09/30/1955 Referring Provider: Jacquelin Hawking PA-C   Encounter Date: 10/08/2017  OT End of Session - 10/08/17 1344    Visit Number  6    Number of Visits  12    Date for OT Re-Evaluation  12/24/17 mini reassess on 10/22/17    Authorization Type  [facility] approval. Effective 09/24/17-03/27/18    OT Start Time  1302    OT Stop Time  1342    OT Time Calculation (min)  40 min    Activity Tolerance  Patient tolerated treatment well       Past Medical History:  Diagnosis Date  . Arthritis   . Chronic back pain   . Chronic shoulder pain   . Depression   . GERD (gastroesophageal reflux disease)   . Hypertension     Past Surgical History:  Procedure Laterality Date  . FOOT FRACTURE SURGERY Left   . REPLACEMENT TOTAL KNEE BILATERAL  2011  . ROTATOR CUFF REPAIR Right   . SPINAL FUSION      There were no vitals filed for this visit.  Subjective Assessment - 10/08/17 1300    Subjective   S: I feel like it's getting better since I did that clothespin thing.     Currently in Pain?  No/denies         [facility] OT Assessment - 10/08/17 1300      Assessment   Medical Diagnosis  right RTC repair      Precautions   Precautions  None    Precaution Comments  progress as tolerated               OT Treatments/Exercises (OP) - 10/08/17 1304      Exercises   Exercises  Shoulder      Shoulder Exercises: Supine   Protraction  PROM;5 reps;AROM;15 reps    Horizontal ABduction  PROM;5 reps;AROM;15 reps    External Rotation  PROM;5 reps    Internal Rotation  PROM;5 reps    Flexion  PROM;5 reps;AROM;15 reps    ABduction  PROM;5 reps;AROM;15 reps      Shoulder Exercises: Standing   Protraction  AROM;12 reps    Horizontal ABduction   AAROM;12 reps    External Rotation  AROM;12 reps    Internal Rotation  AROM;12 reps    Flexion  AROM;12 reps verbal cuing to depress trapezius    ABduction  AROM;12 reps    Extension  Theraband;12 reps    Theraband Level (Shoulder Extension)  Level 2 (Red)    Row  Theraband;12 reps    Theraband Level (Shoulder Row)  Level 2 (Red)    Retraction  Theraband;12 reps    Theraband Level (Shoulder Retraction)  Level 2 (Red)      Shoulder Exercises: ROM/Strengthening   UBE (Upper Arm Bike)  Level 1 2' forward 2' reverse at 4.5 speed    Rhythmic Stabilization, Seated  90 degrees flexion, 30 seconds, mod difficulty    Other ROM/Strengthening Exercises  Pt placed and removed 10 cones from top shelf of cabinet. Min difficulty      Manual Therapy   Manual Therapy  Myofascial release    Manual therapy comments  Manual therapy completed prior to exercises.     Myofascial Release  Myofascial release and manual  stretching completed to right upper arm, trapezius, and scapularis region to decrease pain and restrictions that are prohibiting end range mobiilty.                 OT Short Term Goals - 09/26/17 1103      OT SHORT TERM GOAL #1   Title  Patient will be educated and independent with HEP to faciliate progress in therapy and allow her to return to using her RUE for all daily tasks.     Time  3    Period  Weeks    Status  On-going      OT SHORT TERM GOAL #2   Title  Patient will increase P/ROM to WNL to increase ability to get shirts on and off with less difficulty.     Time  3    Period  Weeks    Status  On-going      OT SHORT TERM GOAL #3   Title  Patient will increase RUE strength to 4-/5 to increase ability to complete activities at shoulder level with less difficulty.     Time  3    Period  Weeks    Status  On-going      OT SHORT TERM GOAL #4   Title  Patient will decrease pain level in RUE to approximately 3/10 or less when completing daily tasks.     Time  3    Period   Weeks    Status  On-going      OT SHORT TERM GOAL #5   Title  Patient will decrease fascial restrictions to min amount to increase functional mobility needed to fix and brush her hair using her RUE.     Time  3    Period  Weeks    Status  On-going        OT Long Term Goals - 09/26/17 1103      OT LONG TERM GOAL #1   Title  Patient will return to highest level of independence with daily and leisure tasks while using her right UE as dominant extremity 100% of the time.    Time  6    Period  Weeks    Status  On-going      OT LONG TERM GOAL #2   Title  Patient will increase A/ROM to WNL to increase ability to reach overhead and out to the side with less difficulty.     Time  6    Period  Weeks    Status  On-going      OT LONG TERM GOAL #3   Title  Patient will increase RUE strength to 4+/5 to increase ability to complete heavy household chores and lifting tasks.     Time  6    Period  Weeks    Status  On-going      OT LONG TERM GOAL #4   Title  Patient will decrease RUE pain to 2/10 or less when completing daily tasks.     Time  6    Period  Weeks    Status  On-going            Plan - 10/08/17 1344    Clinical Impression Statement  A: Completed A/ROM both supine and standing today, pt able to achieve ROM WFL during flexion and abduction in stanning, unable to complete horizontal A/ROM therefore completed AA/ROM. Manual therapy completed to reduce soft tissue restrictions along anterior deltoid to improve ROM and decrease pain. Added  overhead reaching task with cones, min difficulty. Verbal cuing for form and technique during session.     Plan  P: Add sidelying A/ROM, proximal shoulder strengthening in supine       Patient will benefit from skilled therapeutic intervention in order to improve the following deficits and impairments:  Decreased activity tolerance, Decreased strength, Decreased range of motion, Pain, Impaired UE functional use, Increased fascial  restrictions  Visit Diagnosis: Other symptoms and signs involving the musculoskeletal system  Acute pain of right shoulder  Stiffness of right shoulder, not elsewhere classified    Problem List Patient Active Problem List   Diagnosis Date Noted  . Essential hypertension 09/11/2017  . Chronic kidney disease 09/11/2017  . Anemia 09/11/2017  . Prediabetes 09/11/2017   Ezra SitesLeslie Troxler, OTR/L  [phone] 10/08/2017, 1:46 PM  [facility] [address] [place], [place], [address] Phone: [phone]   Fax:  [phone]  Name: Melissa Solis MRN: [id number] Date of Birth: 28-Nov-1955

## 2017-10-14 ENCOUNTER — Other Ambulatory Visit: Payer: Self-pay

## 2017-10-14 ENCOUNTER — Encounter (HOSPITAL_COMMUNITY): Payer: Self-pay

## 2017-10-14 ENCOUNTER — Ambulatory Visit (HOSPITAL_COMMUNITY): Payer: No Typology Code available for payment source

## 2017-10-14 DIAGNOSIS — M25611 Stiffness of right shoulder, not elsewhere classified: Secondary | ICD-10-CM

## 2017-10-14 DIAGNOSIS — M25511 Pain in right shoulder: Secondary | ICD-10-CM

## 2017-10-14 DIAGNOSIS — R29898 Other symptoms and signs involving the musculoskeletal system: Secondary | ICD-10-CM

## 2017-10-14 NOTE — Therapy (Signed)
[facility] [address] [place], [place], [address] Phone: [phone]   Fax:  [phone]  Occupational Therapy Treatment  Patient Details  Name: Melissa Solis MRN: [id number] Date of Birth: 03/23/1956 Referring Provider: Jacquelin HawkingMcElroy, Shannon PA-C   Encounter Date: 10/14/2017  OT End of Session - 10/14/17 1115    Visit Number  7    Number of Visits  12    Date for OT Re-Evaluation  12/24/17 mini reassess on 10/22/17    Authorization Type  [facility] Charity approval. Effective 09/24/17-03/27/18    OT Start Time  1035    OT Stop Time  1115    OT Time Calculation (min)  40 min    Activity Tolerance  Patient tolerated treatment well    Behavior During Therapy  WFL for tasks assessed/performed       Past Medical History:  Diagnosis Date  . Arthritis   . Chronic back pain   . Chronic shoulder pain   . Depression   . GERD (gastroesophageal reflux disease)   . Hypertension     Past Surgical History:  Procedure Laterality Date  . FOOT FRACTURE SURGERY Left   . REPLACEMENT TOTAL KNEE BILATERAL  2011  . ROTATOR CUFF REPAIR Right   . SPINAL FUSION      There were no vitals filed for this visit.  Subjective Assessment - 10/14/17 1058    Subjective   S: I feel like it's sore and tight today.    Currently in Pain?  Yes    Pain Score  5     Pain Location  Shoulder    Pain Orientation  Right    Pain Descriptors / Indicators  Sore    Pain Type  Acute pain    Pain Radiating Towards  n/a    Pain Onset  In the past 7 days    Pain Frequency  Intermittent    Aggravating Factors   increased movement and use    Pain Relieving Factors  ice, heat, pain medication, Biofreeze    Effect of Pain on Daily Activities  min effect. patient pushes through pain.    Multiple Pain Sites  No         [facility] OT Assessment - 10/14/17 1037      Assessment   Medical Diagnosis  right RTC repair      Precautions   Precautions  None    Precaution Comments   progress as tolerated               OT Treatments/Exercises (OP) - 10/14/17 1057      Exercises   Exercises  Shoulder      Shoulder Exercises: Supine   Protraction  PROM;5 reps;AROM;15 reps    Horizontal ABduction  PROM;5 reps;AROM;15 reps    External Rotation  PROM;5 reps    Internal Rotation  PROM;5 reps    Flexion  PROM;5 reps;AROM;15 reps    ABduction  PROM;5 reps;AROM;15 reps      Shoulder Exercises: Sidelying   External Rotation  AROM;12 reps    Internal Rotation  AROM;12 reps    Flexion  AROM;12 reps    ABduction  AROM;12 reps    Other Sidelying Exercises  Protraction; 12X; A/ROM    Other Sidelying Exercises  Horizontal abduction; 12X; A/ROM      Shoulder Exercises: ROM/Strengthening   UBE (Upper Arm Bike)  Level 1 2' forward 2' reverse at 4.5 speed pace: 3.5-4.0    Wall Wash  1'    Proximal Shoulder Strengthening, Seated  12X with no rest breaks      Manual Therapy   Manual Therapy  Myofascial release    Manual therapy comments  Manual therapy completed prior to exercises.     Myofascial Release  Myofascial release and manual stretching completed to right upper arm, trapezius, and scapularis region to decrease pain and restrictions that are prohibiting end range mobiilty.                 OT Short Term Goals - 09/26/17 1103      OT SHORT TERM GOAL #1   Title  Patient will be educated and independent with HEP to faciliate progress in therapy and allow her to return to using her RUE for all daily tasks.     Time  3    Period  Weeks    Status  On-going      OT SHORT TERM GOAL #2   Title  Patient will increase P/ROM to WNL to increase ability to get shirts on and off with less difficulty.     Time  3    Period  Weeks    Status  On-going      OT SHORT TERM GOAL #3   Title  Patient will increase RUE strength to 4-/5 to increase ability to complete activities at shoulder level with less difficulty.     Time  3    Period  Weeks    Status  On-going       OT SHORT TERM GOAL #4   Title  Patient will decrease pain level in RUE to approximately 3/10 or less when completing daily tasks.     Time  3    Period  Weeks    Status  On-going      OT SHORT TERM GOAL #5   Title  Patient will decrease fascial restrictions to min amount to increase functional mobility needed to fix and brush her hair using her RUE.     Time  3    Period  Weeks    Status  On-going        OT Long Term Goals - 09/26/17 1103      OT LONG TERM GOAL #1   Title  Patient will return to highest level of independence with daily and leisure tasks while using her right UE as dominant extremity 100% of the time.    Time  6    Period  Weeks    Status  On-going      OT LONG TERM GOAL #2   Title  Patient will increase A/ROM to WNL to increase ability to reach overhead and out to the side with less difficulty.     Time  6    Period  Weeks    Status  On-going      OT LONG TERM GOAL #3   Title  Patient will increase RUE strength to 4+/5 to increase ability to complete heavy household chores and lifting tasks.     Time  6    Period  Weeks    Status  On-going      OT LONG TERM GOAL #4   Title  Patient will decrease RUE pain to 2/10 or less when completing daily tasks.     Time  6    Period  Weeks    Status  On-going            Plan - 10/14/17 1116  Clinical Impression Statement  A: Pt with moderate amount of fascial restrictions in right anterior deltoid region. Myofascial release and manual techniques completed to area to help decrease trigger point pain and muscle knot. Added sidelying A/ROM to increase shoulder and scapular stability. VC for form and technique needed.     Plan  P: Continue with standing A/ROM and manual therapy to decrease fascial restrictions and trigger points as needed.     Consulted and Agree with Plan of Care  Patient       Patient will benefit from skilled therapeutic intervention in order to improve the following deficits and  impairments:  Decreased activity tolerance, Decreased strength, Decreased range of motion, Pain, Impaired UE functional use, Increased fascial restrictions  Visit Diagnosis: Other symptoms and signs involving the musculoskeletal system  Acute pain of right shoulder  Stiffness of right shoulder, not elsewhere classified    Problem List Patient Active Problem List   Diagnosis Date Noted  . Essential hypertension 09/11/2017  . Chronic kidney disease 09/11/2017  . Anemia 09/11/2017  . Prediabetes 09/11/2017   Limmie Patricia, OTR/L,CBIS  [phone]  10/14/2017, 11:19 AM  [facility] [address] [place], [place], [address] Phone: [phone]   Fax:  [phone]  Name: Melissa Solis MRN: [id number] Date of Birth: [birth year]

## 2017-10-16 ENCOUNTER — Ambulatory Visit (HOSPITAL_COMMUNITY): Payer: No Typology Code available for payment source | Admitting: Occupational Therapy

## 2017-10-16 ENCOUNTER — Encounter (HOSPITAL_COMMUNITY): Payer: Self-pay | Admitting: Occupational Therapy

## 2017-10-16 DIAGNOSIS — R29898 Other symptoms and signs involving the musculoskeletal system: Secondary | ICD-10-CM

## 2017-10-16 DIAGNOSIS — M25611 Stiffness of right shoulder, not elsewhere classified: Secondary | ICD-10-CM

## 2017-10-16 DIAGNOSIS — M25511 Pain in right shoulder: Secondary | ICD-10-CM

## 2017-10-16 NOTE — Therapy (Signed)
[facility] [address] [place], [place], [address] Phone: [phone]   Fax:  [phone]  Occupational Therapy Treatment  Patient Details  Name: Melissa Solis MRN: [id number] Date of Birth: 15-Dec-1955 Referring Provider: Jacquelin Hawking PA-C   Encounter Date: 10/16/2017  OT End of Session - 10/16/17 1119    Visit Number  8    Number of Visits  12    Date for OT Re-Evaluation  12/24/17 mini reassess on 10/22/17    Authorization Type  [facility] Char[facility] approval. Effective 09/24/17-03/27/18    OT Start Time  1034    OT Stop Time  1113    OT Time Calculation (min)  39 min    Activity Tolerance  Patient tolerated treatment well    Behavior During Therapy  WFL for tasks assessed/performed       Past Medical History:  Diagnosis Date  . Arthritis   . Chronic back pain   . Chronic shoulder pain   . Depression   . GERD (gastroesophageal reflux disease)   . Hypertension     Past Surgical History:  Procedure Laterality Date  . FOOT FRACTURE SURGERY Left   . REPLACEMENT TOTAL KNEE BILATERAL  2011  . ROTATOR CUFF REPAIR Right   . SPINAL FUSION      There were no vitals filed for this visit.  Subjective Assessment - 10/16/17 1037    Subjective   S: It's aggravated today.     Currently in Pain?  Yes    Pain Score  5     Pain Location  Shoulder    Pain Orientation  Right    Pain Descriptors / Indicators  Sore    Pain Type  Acute pain    Pain Radiating Towards  n/a    Pain Onset  In the past 7 days    Pain Frequency  Intermittent    Aggravating Factors   increased movement and use    Pain Relieving Factors  ice, heat, pain medication, biofreeze    Effect of Pain on Daily Activities  min effect, pt pushes through the pain    Multiple Pain Sites  No         [facility] OT Assessment - 10/16/17 1036      Assessment   Medical Diagnosis  right RTC repair      Precautions   Precautions  None    Precaution Comments  progress as  tolerated               OT Treatments/Exercises (OP) - 10/16/17 1038      Exercises   Exercises  Shoulder      Shoulder Exercises: Supine   Protraction  PROM;5 reps;Strengthening;12 reps    Protraction Weight (lbs)  1    Horizontal ABduction  PROM;5 reps;AROM;15 reps    External Rotation  PROM;5 reps;Strengthening;12 reps    External Rotation Weight (lbs)  1    Internal Rotation  PROM;5 reps;Strengthening;12 reps    Internal Rotation Weight (lbs)  1    Flexion  PROM;5 reps;AROM;15 reps    ABduction  PROM;5 reps;AROM;15 reps      Shoulder Exercises: Sidelying   External Rotation  AROM;12 reps    Internal Rotation  AROM;12 reps    Flexion  AROM;12 reps    ABduction  AROM;12 reps    Other Sidelying Exercises  Protraction; 12X; A/ROM    Other Sidelying Exercises  Horizontal abduction; 12X; A/ROM      Shoulder  Exercises: Standing   Protraction  AROM;12 reps    Horizontal ABduction  AAROM;12 reps    External Rotation  AROM;12 reps    Internal Rotation  AROM;12 reps    Flexion  AROM;12 reps verbal cuing to depress trapezius    ABduction  AROM;12 reps    Extension  Theraband;12 reps    Theraband Level (Shoulder Extension)  Level 2 (Red)    Row  Theraband;12 reps    Theraband Level (Shoulder Row)  Level 2 (Red)    Retraction  Theraband;12 reps    Theraband Level (Shoulder Retraction)  Level 2 (Red)      Functional Reaching Activities   High Level  Pt placed and removed 10 cones from top shelf of cabinet with minimal difficulty.       Manual Therapy   Manual Therapy  Myofascial release    Manual therapy comments  Manual therapy completed prior to exercises.     Myofascial Release  Myofascial release and manual stretching completed to right upper arm, trapezius, and scapularis region to decrease pain and restrictions that are prohibiting end range mobiilty.               OT Education - 10/16/17 1101    Education provided  Yes    Education Details  A/ROM  standing and sidelying    Person(s) Educated  Patient    Methods  Explanation;Demonstration;Handout    Comprehension  Verbalized understanding;Returned demonstration       OT Short Term Goals - 09/26/17 1103      OT SHORT TERM GOAL #1   Title  Patient will be educated and independent with HEP to faciliate progress in therapy and allow her to return to using her RUE for all daily tasks.     Time  3    Period  Weeks    Status  On-going      OT SHORT TERM GOAL #2   Title  Patient will increase P/ROM to WNL to increase ability to get shirts on and off with less difficulty.     Time  3    Period  Weeks    Status  On-going      OT SHORT TERM GOAL #3   Title  Patient will increase RUE strength to 4-/5 to increase ability to complete activities at shoulder level with less difficulty.     Time  3    Period  Weeks    Status  On-going      OT SHORT TERM GOAL #4   Title  Patient will decrease pain level in RUE to approximately 3/10 or less when completing daily tasks.     Time  3    Period  Weeks    Status  On-going      OT SHORT TERM GOAL #5   Title  Patient will decrease fascial restrictions to min amount to increase functional mobility needed to fix and brush her hair using her RUE.     Time  3    Period  Weeks    Status  On-going        OT Long Term Goals - 09/26/17 1103      OT LONG TERM GOAL #1   Title  Patient will return to highest level of independence with daily and leisure tasks while using her right UE as dominant extremity 100% of the time.    Time  6    Period  Weeks    Status  On-going  OT LONG TERM GOAL #2   Title  Patient will increase A/ROM to WNL to increase ability to reach overhead and out to the side with less difficulty.     Time  6    Period  Weeks    Status  On-going      OT LONG TERM GOAL #3   Title  Patient will increase RUE strength to 4+/5 to increase ability to complete heavy household chores and lifting tasks.     Time  6    Period   Weeks    Status  On-going      OT LONG TERM GOAL #4   Title  Patient will decrease RUE pain to 2/10 or less when completing daily tasks.     Time  6    Period  Weeks    Status  On-going            Plan - 10/16/17 1119    Clinical Impression Statement  A: Pt continues to have a moderate amount of fascial restrictions along the right anterior and medial deltoid regions, myofascial release completed to improve pain and ROM. Continued with sidelying A/ROM this session and resumed standing A/ROM, pt experiencing catching sensation during abduction at 90 degrees. Verbal cuing for form and technique, especially during standing exercises as pt tends to use momentum and not stretch to end ROM. Pt did well with functional reaching today.     Plan  P: Continue with A/ROM working to achieve end range stretch, follow up on HEP       Patient will benefit from skilled therapeutic intervention in order to improve the following deficits and impairments:  Decreased activity tolerance, Decreased strength, Decreased range of motion, Pain, Impaired UE functional use, Increased fascial restrictions  Visit Diagnosis: Other symptoms and signs involving the musculoskeletal system  Acute pain of right shoulder  Stiffness of right shoulder, not elsewhere classified    Problem List Patient Active Problem List   Diagnosis Date Noted  . Essential hypertension 09/11/2017  . Chronic kidney disease 09/11/2017  . Anemia 09/11/2017  . Prediabetes 09/11/2017   Ezra SitesLeslie Abrina Petz, OTR/L  [phone] 10/16/2017, 11:23 AM  [facility] [address] [place], [place], [address] Phone: [phone]   Fax:  [phone]  Name: Chesley NoonLashel Lietz MRN: [id number] Date of Birth: 01/02/1956

## 2017-10-16 NOTE — Patient Instructions (Signed)
Repeat all exercises 10-15 times, 1-2 times per day.  1) Shoulder Protraction    Begin with elbows by your side, slowly "punch" straight out in front of you.      2) Shoulder Flexion  Supine:     Standing:         Begin with arms at your side with thumbs pointed up, slowly raise both arms up and forward towards overhead.               3) Horizontal abduction/adduction  Supine:   Standing:           Begin with arms straight out in front of you, bring out to the side in at "T" shape. Keep arms straight entire time.                 4) Internal & External Rotation    *No band* -Stand with elbows at the side and elbows bent 90 degrees. Move your forearms away from your body, then bring back inward toward the body.     5) Shoulder Abduction  Supine:     Standing:       Lying on your back begin with your arms flat on the table next to your side. Slowly move your arms out to the side so that they go overhead, in a jumping jack or snow angel movement.      Side Lying Exercises: Complete 10-15X each, 1-2x/day   1) Sidelying Flexion:   Lie on your side with your affected arm up. Start with the weight in your top hand by your side. Lift the arm forward and pull your shoulder blade down as the arm lifts up (like a seesaw). NO WEIGHT     2) Sidelying abduction:   Lie on your side with your arm down straight at your side.  Raise the arm up and overhead, keeping the elbow straight.  You may turn the palm forward and inward if you can.     3) Sidelying horizontal abduction:   Lie on your side with arm straight up towards ceiling. Lower the arm straight out to face the wall and bring back up towards ceiling.     4) Sidelying internal/external rotation:   Lie on side with elbow bent. Lower and raise forearm in direction of the ceiling, keeping elbow bent and by the side.

## 2017-10-20 ENCOUNTER — Encounter (HOSPITAL_COMMUNITY): Payer: Self-pay

## 2017-10-20 ENCOUNTER — Other Ambulatory Visit: Payer: Self-pay

## 2017-10-20 ENCOUNTER — Ambulatory Visit (HOSPITAL_COMMUNITY): Payer: No Typology Code available for payment source

## 2017-10-20 DIAGNOSIS — M25611 Stiffness of right shoulder, not elsewhere classified: Secondary | ICD-10-CM

## 2017-10-20 DIAGNOSIS — R29898 Other symptoms and signs involving the musculoskeletal system: Secondary | ICD-10-CM

## 2017-10-20 DIAGNOSIS — M25511 Pain in right shoulder: Secondary | ICD-10-CM

## 2017-10-20 NOTE — Therapy (Signed)
[facility] [address] [place], [place], [address] Phone: [phone]   Fax:  [phone]  Occupational Therapy Treatment  Patient Details  Name: Melissa Solis MRN: [id number] Date of Birth: 12/26/1955 Referring Provider: Jacquelin Hawking PA-C   Encounter Date: 10/20/2017  OT End of Session - 10/20/17 1142    Visit Number  9    Number of Visits  12    Date for OT Re-Evaluation  12/24/17 mini reassess on 10/22/17    Authorization Type  [facility] Char[facility] approval. Effective 09/24/17-03/27/18    OT Start Time  1117    OT Stop Time  1200    OT Time Calculation (min)  43 min    Activity Tolerance  Patient tolerated treatment well    Behavior During Therapy  WFL for tasks assessed/performed       Past Medical History:  Diagnosis Date  . Arthritis   . Chronic back pain   . Chronic shoulder pain   . Depression   . GERD (gastroesophageal reflux disease)   . Hypertension     Past Surgical History:  Procedure Laterality Date  . FOOT FRACTURE SURGERY Left   . REPLACEMENT TOTAL KNEE BILATERAL  2011  . ROTATOR CUFF REPAIR Right   . SPINAL FUSION      There were no vitals filed for this visit.  Subjective Assessment - 10/20/17 1134    Subjective   S: It feels stiff and swollen.    Currently in Pain?  Yes    Pain Score  3     Pain Location  Shoulder    Pain Orientation  Right    Pain Descriptors / Indicators  Sore    Pain Type  Acute pain         [facility] OT Assessment - 10/20/17 1135      Assessment   Medical Diagnosis  right RTC repair      Precautions   Precautions  None    Precaution Comments  progress as tolerated               OT Treatments/Exercises (OP) - 10/20/17 1135      Exercises   Exercises  Shoulder      Shoulder Exercises: Supine   Protraction  PROM;5 reps;Strengthening;12 reps    Protraction Weight (lbs)  1    Horizontal ABduction  PROM;5 reps;AROM;15 reps    External Rotation  PROM;5  reps;Strengthening;12 reps    External Rotation Weight (lbs)  1    Internal Rotation  PROM;5 reps;Strengthening;12 reps    Internal Rotation Weight (lbs)  1    Flexion  PROM;5 reps;Strengthening;10 reps    Shoulder Flexion Weight (lbs)  1    ABduction  PROM;5 reps;AROM;15 reps      Shoulder Exercises: Sidelying   External Rotation  AROM;12 reps    Internal Rotation  AROM;12 reps    Flexion  AROM;12 reps    ABduction  AROM;12 reps    Other Sidelying Exercises  Protraction; 12X; A/ROM    Other Sidelying Exercises  Horizontal abduction; 12X; A/ROM      Shoulder Exercises: Standing   Extension  Theraband;12 reps    Theraband Level (Shoulder Extension)  Level 2 (Red)    Row  Theraband;12 reps    Theraband Level (Shoulder Row)  Level 2 (Red)    Retraction  Theraband;12 reps    Theraband Level (Shoulder Retraction)  Level 2 (Red)      Shoulder Exercises: ROM/Strengthening  UBE (Upper Arm Bike)  Level 2 2' forward 2' reverse at 4.5 speed pace: 3.5-4.0    Proximal Shoulder Strengthening, Seated  12X with no rest breaks      Manual Therapy   Manual Therapy  Myofascial release    Manual therapy comments  Manual therapy completed prior to exercises.     Myofascial Release  Myofascial release and manual stretching completed to right upper arm, trapezius, and scapularis region to decrease pain and restrictions that are prohibiting end range mobiilty.                 OT Short Term Goals - 09/26/17 1103      OT SHORT TERM GOAL #1   Title  Patient will be educated and independent with HEP to faciliate progress in therapy and allow her to return to using her RUE for all daily tasks.     Time  3    Period  Weeks    Status  On-going      OT SHORT TERM GOAL #2   Title  Patient will increase P/ROM to WNL to increase ability to get shirts on and off with less difficulty.     Time  3    Period  Weeks    Status  On-going      OT SHORT TERM GOAL #3   Title  Patient will increase RUE  strength to 4-/5 to increase ability to complete activities at shoulder level with less difficulty.     Time  3    Period  Weeks    Status  On-going      OT SHORT TERM GOAL #4   Title  Patient will decrease pain level in RUE to approximately 3/10 or less when completing daily tasks.     Time  3    Period  Weeks    Status  On-going      OT SHORT TERM GOAL #5   Title  Patient will decrease fascial restrictions to min amount to increase functional mobility needed to fix and brush her hair using her RUE.     Time  3    Period  Weeks    Status  On-going        OT Long Term Goals - 09/26/17 1103      OT LONG TERM GOAL #1   Title  Patient will return to highest level of independence with daily and leisure tasks while using her right UE as dominant extremity 100% of the time.    Time  6    Period  Weeks    Status  On-going      OT LONG TERM GOAL #2   Title  Patient will increase A/ROM to WNL to increase ability to reach overhead and out to the side with less difficulty.     Time  6    Period  Weeks    Status  On-going      OT LONG TERM GOAL #3   Title  Patient will increase RUE strength to 4+/5 to increase ability to complete heavy household chores and lifting tasks.     Time  6    Period  Weeks    Status  On-going      OT LONG TERM GOAL #4   Title  Patient will decrease RUE pain to 2/10 or less when completing daily tasks.     Time  6    Period  Weeks    Status  On-going  Plan - 10/20/17 1151    Clinical Impression Statement  A: Patient continues to have a moderate amount of fascial restrictions along the Right anterior and medial deltoid regions as well as her upper trapezius. Manual therapy techniques completed to address. Pain with passive abduction and continues to require manual traction during movement. VC for form and technique.    Plan  P: Mini reassessment     Consulted and Agree with Plan of Care  Patient       Patient will benefit from skilled  therapeutic intervention in order to improve the following deficits and impairments:  Decreased activity tolerance, Decreased strength, Decreased range of motion, Pain, Impaired UE functional use, Increased fascial restrictions  Visit Diagnosis: Other symptoms and signs involving the musculoskeletal system  Acute pain of right shoulder  Stiffness of right shoulder, not elsewhere classified    Problem List Patient Active Problem List   Diagnosis Date Noted  . Essential hypertension 09/11/2017  . Chronic kidney disease 09/11/2017  . Anemia 09/11/2017  . Prediabetes 09/11/2017   Limmie Patricia, OTR/L,CBIS  [phone]  10/20/2017, 11:54 AM  [facility] [address] [place], [place], [address] Phone: [phone]   Fax:  [phone]  Name: Melissa Solis MRN: [id number] Date of Birth: 03-27-1956

## 2017-10-21 ENCOUNTER — Telehealth (HOSPITAL_COMMUNITY): Payer: Self-pay | Admitting: Physician Assistant

## 2017-10-21 NOTE — Telephone Encounter (Signed)
10/21/17  Pt called and needed to reschedule

## 2017-10-22 ENCOUNTER — Ambulatory Visit (HOSPITAL_COMMUNITY): Payer: No Typology Code available for payment source

## 2017-10-23 ENCOUNTER — Encounter (HOSPITAL_COMMUNITY): Payer: Self-pay | Admitting: Occupational Therapy

## 2017-10-23 ENCOUNTER — Ambulatory Visit (HOSPITAL_COMMUNITY): Payer: No Typology Code available for payment source | Admitting: Occupational Therapy

## 2017-10-23 DIAGNOSIS — M25611 Stiffness of right shoulder, not elsewhere classified: Secondary | ICD-10-CM

## 2017-10-23 DIAGNOSIS — R29898 Other symptoms and signs involving the musculoskeletal system: Secondary | ICD-10-CM

## 2017-10-23 DIAGNOSIS — M25511 Pain in right shoulder: Secondary | ICD-10-CM

## 2017-10-23 NOTE — Therapy (Signed)
[place], [place], [address] Phone: [phone]   Fax:  [phone]  Occupational Therapy Treatment  Patient Details  Name: Melissa Solis MRN: [id number] Date of Birth: [birth year] Referring Provider: Soyla Dryer PA-C   Encounter Date: 10/23/2017  OT End of Session - 10/23/17 1438    Visit Number  10    Number of Visits  12    Date for OT Re-Evaluation  12/24/17    Authorization Type  [facility] [employer] approval. Effective 09/24/17-03/27/18    OT Start Time  1354    OT Stop Time  1434    OT Time Calculation (min)  40 min    Activity Tolerance  Patient tolerated treatment well    Behavior During Therapy  WFL for tasks assessed/performed       Past Medical History:  Diagnosis Date  . Arthritis   . Chronic back pain   . Chronic shoulder pain   . Depression   . GERD (gastroesophageal reflux disease)   . Hypertension     Past Surgical History:  Procedure Laterality Date  . FOOT FRACTURE SURGERY Left   . REPLACEMENT TOTAL KNEE BILATERAL  2011  . ROTATOR CUFF REPAIR Right   . SPINAL FUSION      There were no vitals filed for this visit.  Subjective Assessment - 10/23/17 1354    Subjective   S: It's been achey for a couple of days.     Currently in Pain?  Yes    Pain Score  5     Pain Location  Shoulder    Pain Orientation  Right    Pain Descriptors / Indicators  Aching    Pain Type  Acute pain    Pain Radiating Towards  around scapula    Pain Onset  In the past 7 days    Pain Frequency  Intermittent    Aggravating Factors   increased movement and use    Pain Relieving Factors  ice, heat, pain medication, biofreeze    Effect of Pain on Daily Activities  min effect, pushes through the pain    Multiple Pain Sites  No         [facility] OT Assessment - 10/23/17 1354      Assessment   Medical Diagnosis  right RTC repair      Precautions   Precautions  None    Precaution Comments  progress as  tolerated      Palpation   Palpation comment  Mod fascial restrictions in right upper arm, trapezius, and scapularis region.       AROM   Overall AROM Comments  Assessed seated. IR/er adducted.    Right Shoulder Flexion  107 Degrees 105 previous    Right Shoulder ABduction  75 Degrees 62 previous    Right Shoulder Internal Rotation  90 Degrees same as previous    Right Shoulder External Rotation  64 Degrees 58 previous      PROM   Overall PROM Comments  Assessed supine. IR/er adducted.     PROM Assessment Site  Shoulder    Right/Left Shoulder  Right    Right Shoulder Flexion  175 Degrees 150 previous    Right Shoulder ABduction  180 Degrees 115 previous    Right Shoulder Internal Rotation  90 Degrees same as previous    Right Shoulder External Rotation  70 Degrees same as previous      Strength   Overall Strength Comments  Assessed seated. IR/er adducted    Right/Left Shoulder  Right    Right Shoulder Flexion  3-/5 same as previous    Right Shoulder ABduction  3-/5 same as previous    Right Shoulder Internal Rotation  4-/5 same as previous    Right Shoulder External Rotation  4-/5 same as previous               OT Treatments/Exercises (OP) - 10/23/17 1404      Exercises   Exercises  Shoulder      Shoulder Exercises: Supine   Protraction  PROM;5 reps;Strengthening;12 reps    Protraction Weight (lbs)  1    Horizontal ABduction  PROM;5 reps;Strengthening;10 reps    Horizontal ABduction Weight (lbs)  1    External Rotation  PROM;5 reps;Strengthening;12 reps    External Rotation Weight (lbs)  1    Internal Rotation  PROM;5 reps;Strengthening;12 reps    Internal Rotation Weight (lbs)  1    Flexion  PROM;5 reps;Strengthening;10 reps    Shoulder Flexion Weight (lbs)  1    ABduction  PROM;5 reps;Limitations    ABduction Limitations  unable to complete A/ROM      Shoulder Exercises: Standing   Protraction  AROM;12 reps    Horizontal ABduction  AAROM;12 reps     External Rotation  AROM;12 reps;Theraband;10 reps    Theraband Level (Shoulder External Rotation)  Level 2 (Red)    Internal Rotation  AROM;12 reps;Theraband;10 reps    Theraband Level (Shoulder Internal Rotation)  Level 2 (Red)    Flexion  AROM;12 reps    ABduction  AROM;12 reps    Extension  Theraband;12 reps    Theraband Level (Shoulder Extension)  Level 2 (Red)    Row  Theraband;12 reps    Theraband Level (Shoulder Row)  Level 2 (Red)    Retraction  Theraband;12 reps    Theraband Level (Shoulder Retraction)  Level 2 (Red)      Shoulder Exercises: ROM/Strengthening   UBE (Upper Arm Bike)  Level 2 2' forward 2' reverse at 4.5 speed pace: 3.5-4.0    Over Head Lace  1'    Proximal Shoulder Strengthening, Seated  12X with 1 rest break    Other ROM/Strengthening Exercises  PVC pipe slides; flexion 10X    Other ROM/Strengthening Exercises  Pt placed and removed 10 cones from middle shelf of cabinet. Min difficulty               OT Short Term Goals - 10/23/17 1438      OT SHORT TERM GOAL #1   Title  Patient will be educated and independent with HEP to faciliate progress in therapy and allow her to return to using her RUE for all daily tasks.     Time  3    Period  Weeks    Status  On-going      OT SHORT TERM GOAL #2   Title  Patient will increase P/ROM to WNL to increase ability to get shirts on and off with less difficulty.     Time  3    Period  Weeks    Status  Achieved      OT SHORT TERM GOAL #3   Title  Patient will increase RUE strength to 4-/5 to increase ability to complete activities at shoulder level with less difficulty.     Time  3    Period  Weeks    Status  Partially Met  OT SHORT TERM GOAL #4   Title  Patient will decrease pain level in RUE to approximately 3/10 or less when completing daily tasks.     Time  3    Period  Weeks    Status  On-going      OT SHORT TERM GOAL #5   Title  Patient will decrease fascial restrictions to min amount to  increase functional mobility needed to fix and brush her hair using her RUE.     Time  3    Period  Weeks    Status  On-going        OT Long Term Goals - 09/26/17 1103      OT LONG TERM GOAL #1   Title  Patient will return to highest level of independence with daily and leisure tasks while using her right UE as dominant extremity 100% of the time.    Time  6    Period  Weeks    Status  On-going      OT LONG TERM GOAL #2   Title  Patient will increase A/ROM to WNL to increase ability to reach overhead and out to the side with less difficulty.     Time  6    Period  Weeks    Status  On-going      OT LONG TERM GOAL #3   Title  Patient will increase RUE strength to 4+/5 to increase ability to complete heavy household chores and lifting tasks.     Time  6    Period  Weeks    Status  On-going      OT LONG TERM GOAL #4   Title  Patient will decrease RUE pain to 2/10 or less when completing daily tasks.     Time  6    Period  Weeks    Status  On-going            Plan - 10/23/17 1438    Clinical Impression Statement  A: Mini reassessment completed this session, pt has met 1 STG and partially met an additional STG. Did not complete manual therapy as pt arrived late to session. Pt with increased soreness today limiting A/ROM and strength, resumed PVC pipe slide during which she is able to achieve full range. Pt unable to place cones on top shelf, only reaching middle shelf today. Verbal cuing during session for form and technique. Ice applied after session for pain/edema.     Plan  P: continue working on achieving A/ROM [facility] to WNL during exercises and improving strength required for ADL completion       Patient will benefit from skilled therapeutic intervention in order to improve the following deficits and impairments:  Decreased activity tolerance, Decreased strength, Decreased range of motion, Pain, Impaired UE functional use, Increased fascial restrictions  Visit  Diagnosis: Other symptoms and signs involving the musculoskeletal system  Acute pain of right shoulder  Stiffness of right shoulder, not elsewhere classified    Problem List Patient Active Problem List   Diagnosis Date Noted  . Essential hypertension 09/11/2017  . Chronic kidney disease 09/11/2017  . Anemia 09/11/2017  . Prediabetes 09/11/2017   Guadelupe Sabin, OTR/L  [phone] 10/23/2017, 2:41 PM  [facility] [address] [place], [place], [address] Phone: [phone]   Fax:  [phone]  Name: Melissa Solis MRN: [id number] Date of Birth: 1956-04-04

## 2017-10-24 ENCOUNTER — Ambulatory Visit
Admission: RE | Admit: 2017-10-24 | Discharge: 2017-10-24 | Disposition: A | Discharge status: Home / Self Care | Payer: No Typology Code available for payment source | Source: Ambulatory Visit | Attending: Physician Assistant | Admitting: Physician Assistant

## 2017-10-24 DIAGNOSIS — Z1231 Encounter for screening mammogram for malignant neoplasm of breast: Secondary | ICD-10-CM

## 2017-10-28 ENCOUNTER — Emergency Department (HOSPITAL_COMMUNITY)
Admission: EM | Admit: 2017-10-28 | Discharge: 2017-10-28 | Disposition: A | Discharge status: Home / Self Care | Payer: No Typology Code available for payment source | Attending: Emergency Medicine | Admitting: Emergency Medicine

## 2017-10-28 ENCOUNTER — Encounter (HOSPITAL_COMMUNITY): Payer: Self-pay | Admitting: Emergency Medicine

## 2017-10-28 ENCOUNTER — Other Ambulatory Visit: Payer: Self-pay

## 2017-10-28 DIAGNOSIS — G8929 Other chronic pain: Secondary | ICD-10-CM

## 2017-10-28 DIAGNOSIS — R2231 Localized swelling, mass and lump, right upper limb: Secondary | ICD-10-CM | POA: Insufficient documentation

## 2017-10-28 DIAGNOSIS — I10 Essential (primary) hypertension: Secondary | ICD-10-CM | POA: Insufficient documentation

## 2017-10-28 DIAGNOSIS — Z5321 Procedure and treatment not carried out due to patient leaving prior to being seen by health care provider: Secondary | ICD-10-CM | POA: Insufficient documentation

## 2017-10-28 DIAGNOSIS — N644 Mastodynia: Secondary | ICD-10-CM

## 2017-10-28 DIAGNOSIS — Z79899 Other long term (current) drug therapy: Secondary | ICD-10-CM | POA: Insufficient documentation

## 2017-10-28 DIAGNOSIS — M25511 Pain in right shoulder: Secondary | ICD-10-CM | POA: Insufficient documentation

## 2017-10-28 DIAGNOSIS — Z87891 Personal history of nicotine dependence: Secondary | ICD-10-CM | POA: Insufficient documentation

## 2017-10-28 MED ORDER — TRAMADOL HCL 50 MG PO TABS: 50.0000 mg | ORAL_TABLET | Freq: Four times a day (QID) | ORAL | 0 refills | Status: DC | PRN

## 2017-10-28 MED ORDER — KETOROLAC TROMETHAMINE 60 MG/2ML IM SOLN
60.0000 mg | Freq: Once | INTRAMUSCULAR | Status: AC
  Administered 2017-10-28: 60 mg via INTRAMUSCULAR
  Filled 2017-10-28: qty 2

## 2017-10-28 NOTE — ED Notes (Signed)
Received call from registration stating patient left at 1249. Pt stated she had another appointment to go to and will return later.

## 2017-10-28 NOTE — ED Triage Notes (Signed)
Pt reports "swelling" near right axilla x1 month. Pt reports has had right rotator cuff surgery in January. Pt also states has had lymph nodes removed from left breast and recently had routine mammogram on right breast. Pt denies any known injury or pimple to site.

## 2017-10-28 NOTE — ED Triage Notes (Signed)
Pt c/o non tender lump that changes in size to RT axilla x 1 month. Pt had a mammogram performed on Thursday.

## 2017-10-28 NOTE — Discharge Instructions (Signed)
You can call Dr. Mort Sawyers office to a arrange a follow-up appointment regarding his shoulder.  Be sure to keep your upcoming diagnostic mammogram appointment for May.  Follow-up with your primary provider for recheck.

## 2017-10-29 ENCOUNTER — Other Ambulatory Visit: Payer: Self-pay | Admitting: Obstetrics and Gynecology

## 2017-10-29 ENCOUNTER — Ambulatory Visit (HOSPITAL_COMMUNITY): Payer: Self-pay | Attending: Physician Assistant

## 2017-10-29 ENCOUNTER — Other Ambulatory Visit (HOSPITAL_COMMUNITY): Payer: Self-pay | Admitting: *Deleted

## 2017-10-29 ENCOUNTER — Encounter (HOSPITAL_COMMUNITY): Payer: Self-pay

## 2017-10-29 ENCOUNTER — Other Ambulatory Visit: Payer: Self-pay

## 2017-10-29 DIAGNOSIS — M25511 Pain in right shoulder: Secondary | ICD-10-CM | POA: Insufficient documentation

## 2017-10-29 DIAGNOSIS — R29898 Other symptoms and signs involving the musculoskeletal system: Secondary | ICD-10-CM | POA: Insufficient documentation

## 2017-10-29 DIAGNOSIS — M25611 Stiffness of right shoulder, not elsewhere classified: Secondary | ICD-10-CM | POA: Insufficient documentation

## 2017-10-29 DIAGNOSIS — R928 Other abnormal and inconclusive findings on diagnostic imaging of breast: Secondary | ICD-10-CM

## 2017-10-29 NOTE — ED Provider Notes (Signed)
[facility] EMERGENCY DEPARTMENT Provider Note   CSN: [id number] Arrival date & time: 10/28/17  1354     History   Chief Complaint Chief Complaint  Patient presents with  . Arm Problem    HPI Melissa Solis is a 62 y.o. female.  HPI   Melissa Solis is a 62 y.o. female who presents to the Emergency Department complaining of right shoulder pain and pain to her right axilla.  Symptoms have been present for 1 month.  She states that shoulder pain is secondary to a rotator cuff repair from 2 months ago.  She states she is currently taking physical therapy twice a week without improvement.  She describes an aching pain associated with movement especially raising her right arm.  She also complains of intermittent swelling to her right axilla for 1 month.  She states that she had a mammogram performed last week and was told there is a possible mass to her right breast and she is scheduled for a diagnostic mammogram on May 30.  She has taken over-the-counter pain relievers without improvement.  She denies any fever, chills, numbness or weakness of the extremities, neck pain, headaches, and redness to the axilla or shoulder.   Past Medical History:  Diagnosis Date  . Arthritis   . Chronic back pain   . Chronic shoulder pain   . Depression   . GERD (gastroesophageal reflux disease)   . Hypertension     Patient Active Problem List   Diagnosis Date Noted  . Essential hypertension 09/11/2017  . Chronic kidney disease 09/11/2017  . Anemia 09/11/2017  . Prediabetes 09/11/2017    Past Surgical History:  Procedure Laterality Date  . FOOT FRACTURE SURGERY Left   . REPLACEMENT TOTAL KNEE BILATERAL  2011  . ROTATOR CUFF REPAIR Right   . SPINAL FUSION       OB History   None      Home Medications    Prior to Admission medications   Medication Sig Start Date End Date Taking? Authorizing Provider  cetirizine (ZYRTEC) 10 MG tablet Take 10 mg by mouth daily.    [provider]  Cholecalciferol (VITAMIN D3) 2000 units TABS Take 1 tablet by mouth daily.    [provider]  DOCUSATE SODIUM PO Take 1 tablet by mouth as needed.    [provider]  hydrochlorothiazide (MICROZIDE) 12.5 MG capsule Take 1 capsule (12.5 mg total) by mouth daily. 10/08/17   Jacquelin Hawking, PA-C  HYDROcodone-acetaminophen (NORCO/VICODIN) 5-325 MG tablet Take 1 tablet by mouth every 6 (six) hours as needed for moderate pain.    [provider]  irbesartan (AVAPRO) 150 MG tablet Take 1 tablet (150 mg total) by mouth daily. 10/08/17   Jacquelin Hawking, PA-C  Iron Combinations (IRON COMPLEX) CAPS daily Patient not taking: Reported on 10/02/2017 09/11/17   Jacquelin Hawking, PA-C  metoprolol tartrate (LOPRESSOR) 100 MG tablet Take 1 tablet (100 mg total) by mouth 2 (two) times daily. Patient not taking: Reported on 10/02/2017 09/11/17   Jacquelin Hawking, PA-C  nabumetone (RELAFEN) 500 MG tablet Take 500 mg by mouth 2 (two) times daily as needed for moderate pain.     [provider]  omeprazole (PRILOSEC) 40 MG capsule Take 1 capsule (40 mg total) by mouth daily. Patient not taking: Reported on 10/02/2017 09/11/17   Jacquelin Hawking, PA-C  traMADol (ULTRAM) 50 MG tablet Take 1 tablet (50 mg total) by mouth every 6 (six) hours as needed. 10/28/17   Ellaree Gear,  Avantae Bither, PA-C    Family History Family History  Problem Relation Age of Onset  . Hypertension Mother   . Diabetes Mother   . Hypertension Sister   . Stroke Sister   . Diabetes Brother   . Heart disease Brother   . Stroke Brother   . Hypertension Maternal Uncle   . Heart attack Maternal Uncle   . Stroke Maternal Uncle   . Hypertension Maternal Grandfather   . Heart disease Maternal Grandfather     Social History Social History   Tobacco Use  . Smoking status: Former Smoker    Packs/day: 0.25    Years: 20.00    Pack years: 5.00    Types: Cigarettes    Last attempt to quit: 09/20/2016    Years  since quitting: 1.1  . Smokeless tobacco: Never Used  Substance Use Topics  . Alcohol use: No    Frequency: Never    Comment: wine "every now and then"  . Drug use: No     Allergies   Ace inhibitors   Review of Systems Review of Systems  Constitutional: Negative for chills, fatigue and fever.  Respiratory: Negative for chest tightness and shortness of breath.   Cardiovascular: Negative for chest pain.  Gastrointestinal: Negative for nausea and vomiting.  Musculoskeletal: Positive for arthralgias (Right shoulder pain). Negative for joint swelling, neck pain and neck stiffness.  Skin: Negative for color change, rash and wound.  Neurological: Negative for facial asymmetry, weakness, numbness and headaches.  All other systems reviewed and are negative.    Physical Exam Updated Vital Signs BP 111/89 (BP Location: Right Arm)   Pulse 93   Temp 98.4 F (36.9 C) (Oral)   Resp 14   Ht  (1.676 m)   Wt 99.3 kg (219 lb)   SpO2 100%   BMI 35.35 kg/m   Physical Exam  Constitutional: She is oriented to person, place, and time. She appears well-developed and well-nourished. No distress.  HENT:  Head: Atraumatic.  Mouth/Throat: Oropharynx is clear and moist.  Neck: Trachea normal, normal range of motion and phonation normal. Neck supple. No spinous process tenderness and no muscular tenderness present. Normal range of motion present.  Cardiovascular: Normal rate, regular rhythm and intact distal pulses.  No murmur heard. Pulmonary/Chest: Effort normal and breath sounds normal. No respiratory distress. She exhibits no tenderness. Right breast exhibits tenderness. Right breast exhibits no inverted nipple, no mass, no nipple discharge and no skin change. Left breast exhibits no inverted nipple, no mass, no nipple discharge and no tenderness.  Tenderness at the 10 o'clock position of the right upper breast, no definite palpable mass, no skin changes, or erythema.  No fluctuance,  erythema or induration of the axilla    Abdominal: Soft. She exhibits no distension. There is no tenderness. There is no guarding.  Musculoskeletal: Normal range of motion. She exhibits tenderness.  Tenderness to palpation of the right AC joint.  No edema, erythema, or excessive warmth.  3 small well-healed surgical scars are present.  Pain reproduced with abduction of the right shoulder.  No sensory or motor weakness.  Neurological: She is alert and oriented to person, place, and time. No sensory deficit.  Skin: Skin is warm. Capillary refill takes less than 2 seconds. No rash noted.  Psychiatric: She has a normal mood and affect.  Nursing note and vitals reviewed.    ED Treatments / Results  Labs (all labs ordered are listed, but only abnormal results are displayed) Labs  Reviewed - No data to display  EKG None  Radiology No results found.  Procedures Procedures (including critical care time)  Medications Ordered in ED Medications  ketorolac (TORADOL) injection 60 mg (60 mg Intramuscular Given 10/28/17 1507)     Initial Impression / Assessment and Plan / ED Course  I have reviewed the triage vital signs and the nursing notes.  Pertinent labs & imaging results that were available during my care of the patient were reviewed by me and considered in my medical decision making (see chart for details).     Patient well-appearing.  Vital signs reviewed.  Patient with likely inflammatory process of the right shoulder.  Shoulder pain is chronic.  On further review of medical records, patient had mammogram on 10/24/2017 that showed a possible mass to the right breast.  I have confirmed that patient is scheduled for a diagnostic mammogram and ultrasound of the right breast on 11/25/2017.  Based on exam findings today I do not feel that this is an abscess.  Patient agrees to treatment plan with addressing her shoulder pain she understands that she will need follow-up with her PCP or  gynecologist after her mammogram is completed.  Final Clinical Impressions(s) / ED Diagnoses   Final diagnoses:  Chronic right shoulder pain  Breast pain    ED Discharge Orders        Ordered    traMADol (ULTRAM) 50 MG tablet  Every 6 hours PRN     10/28/17 1520       Pauline Aus, PA-C 10/29/17 0923    Donnetta Hutching, MD 10/31/17 [phone]

## 2017-10-29 NOTE — Patient Instructions (Signed)
(  Home) Extension: Isometric / Bilateral Arm Retraction - Sitting   Facing anchor, hold hands and elbow at shoulder height, with elbow bent.  Pull arms back to squeeze shoulder blades together. Repeat 10-15 times. 1-3 times/day.   (Clinic) Extension / Flexion (Assist)   Face anchor, pull arms back, keeping elbow straight, and squeze shoulder blades together. Repeat 10-15 times. 1-3 times/day.   Copyright  V[employer]. All rights reserved.   (Home) Retraction: Row - Bilateral (Anchor)   Facing anchor, arms reaching forward, pull hands toward stomach, keeping elbows bent and at your sides and pinching shoulder blades together. Repeat 10-15 times. 1-3 times/day.   Copyright  VHI. All rights reserved.    Face wall with arm in a Y and lift off 10 times.

## 2017-10-29 NOTE — Therapy (Signed)
[place], [place], [address] Phone: [phone]   Fax:  [phone]  Occupational Therapy Treatment  Patient Details  Name: Melissa Solis MRN: [id number] Date of Birth: Jul 01, 1956 Referring Provider: Soyla Dryer PA-C   Encounter Date: 10/29/2017  OT End of Session - 10/29/17 1026    Visit Number  11    Number of Visits  12    Date for OT Re-Evaluation  12/24/17    Authorization Type  [facility] Charity approval. Effective 09/24/17-03/27/18    OT Start Time  1000 pt arrived late    OT Stop Time  1030    OT Time Calculation (min)  30 min    Activity Tolerance  Patient tolerated treatment well    Behavior During Therapy  WFL for tasks assessed/performed       Past Medical History:  Diagnosis Date  . Arthritis   . Chronic back pain   . Chronic shoulder pain   . Depression   . GERD (gastroesophageal reflux disease)   . Hypertension     Past Surgical History:  Procedure Laterality Date  . FOOT FRACTURE SURGERY Left   . REPLACEMENT TOTAL KNEE BILATERAL  2011  . ROTATOR CUFF REPAIR Right   . SPINAL FUSION      There were no vitals filed for this visit.  Subjective Assessment - 10/29/17 1007    Subjective   S: I have this lump on my right breast. They want me to get more testing done on it.     Currently in Pain?  Yes    Pain Score  3     Pain Location  Shoulder    Pain Orientation  Left    Pain Descriptors / Indicators  Aching    Pain Type  Acute pain         [facility] OT Assessment - 10/29/17 1008      Assessment   Medical Diagnosis  right RTC repair      Precautions   Precautions  None    Precaution Comments  progress as tolerated               OT Treatments/Exercises (OP) - 10/29/17 1008      Exercises   Exercises  Shoulder      Shoulder Exercises: Supine   Protraction  PROM;5 reps;Strengthening;12 reps    Protraction Weight (lbs)  1    Horizontal ABduction  PROM;5  reps;Strengthening;12 reps    Horizontal ABduction Weight (lbs)  1    External Rotation  PROM;5 reps;Strengthening;12 reps    External Rotation Weight (lbs)  1    Internal Rotation  PROM;5 reps;Strengthening;12 reps    Internal Rotation Weight (lbs)  1    Flexion  PROM;5 reps;Strengthening;12 reps    Shoulder Flexion Weight (lbs)  1    ABduction  PROM;5 reps;Strengthening;12 reps    Shoulder ABduction Weight (lbs)  1      Shoulder Exercises: Standing   Protraction  Strengthening;10 reps    Protraction Weight (lbs)  1    Horizontal ABduction  Strengthening;10 reps    Horizontal ABduction Weight (lbs)  1    Horizontal ABduction Limitations  several rest breaks taken after each 1-2 repetitions.     External Rotation  Strengthening;10 reps    External Rotation Weight (lbs)  1    Internal Rotation  Strengthening;10 reps    Internal Rotation Weight (lbs)  1    Flexion  Strengthening;10 reps  Shoulder Flexion Weight (lbs)  1    ABduction  Strengthening;10 reps    Shoulder ABduction Weight (lbs)  1    Extension  Theraband;12 reps    Theraband Level (Shoulder Extension)  Level 2 (Red)    Row  Theraband;12 reps    Theraband Level (Shoulder Row)  Level 2 (Red)    Retraction  Theraband;12 reps    Theraband Level (Shoulder Retraction)  Level 2 (Red)      Shoulder Exercises: ROM/Strengthening   UBE (Upper Arm Bike)  Level 2 2' forward 2' reverse at 4.5 speed pace: 4.0    Wall Wash  1'    Over Head Lace  2'    Proximal Shoulder Strengthening, Supine  12X with 1# no rest breaks    Proximal Shoulder Strengthening, Seated  12X with no rest breaks    Other ROM/Strengthening Exercises  Y arm lift off from wall; 10X             OT Education - 10/29/17 1030    Education provided  Yes    Education Details  red band scapular strengthening. Y arm lift off.     Person(s) Educated  Patient    Methods  Explanation;Demonstration;Handout    Comprehension  Returned demonstration;Verbalized  understanding       OT Short Term Goals - 10/29/17 1033      OT SHORT TERM GOAL #1   Title  Patient will be educated and independent with HEP to faciliate progress in therapy and allow her to return to using her RUE for all daily tasks.     Time  3    Period  Weeks    Status  On-going      OT SHORT TERM GOAL #2   Title  Patient will increase P/ROM to WNL to increase ability to get shirts on and off with less difficulty.     Time  3    Period  Weeks      OT SHORT TERM GOAL #3   Title  Patient will increase RUE strength to 4-/5 to increase ability to complete activities at shoulder level with less difficulty.     Time  3    Period  Weeks    Status  Partially Met      OT SHORT TERM GOAL #4   Title  Patient will decrease pain level in RUE to approximately 3/10 or less when completing daily tasks.     Time  3    Period  Weeks    Status  On-going      OT SHORT TERM GOAL #5   Title  Patient will decrease fascial restrictions to min amount to increase functional mobility needed to fix and brush her hair using her RUE.     Time  3    Period  Weeks    Status  On-going        OT Long Term Goals - 09/26/17 1103      OT LONG TERM GOAL #1   Title  Patient will return to highest level of independence with daily and leisure tasks while using her right UE as dominant extremity 100% of the time.    Time  6    Period  Weeks    Status  On-going      OT LONG TERM GOAL #2   Title  Patient will increase A/ROM to WNL to increase ability to reach overhead and out to the side with less difficulty.  Time  6    Period  Weeks    Status  On-going      OT LONG TERM GOAL #3   Title  Patient will increase RUE strength to 4+/5 to increase ability to complete heavy household chores and lifting tasks.     Time  6    Period  Weeks    Status  On-going      OT LONG TERM GOAL #4   Title  Patient will decrease RUE pain to 2/10 or less when completing daily tasks.     Time  6    Period  Weeks     Status  On-going            Plan - 10/29/17 1031    Clinical Impression Statement  A> Pt arrived late so manual therapy was held due to time constraint. Session focused on strengthening of shoulder and scapular. Pt continues to have a pinching sensation when completing abduction requiring manual traction though range. HEP was updated to include scapular strengthening with band. VC for form and technique.     Plan  P: Hold manual therapy near axilla area due to lump. Patient to have follow up testing. Continue with strengthening. Add X to V arms.     Consulted and Agree with Plan of Care  Patient       Patient will benefit from skilled therapeutic intervention in order to improve the following deficits and impairments:  Decreased activity tolerance, Decreased strength, Decreased range of motion, Pain, Impaired UE functional use, Increased fascial restrictions  Visit Diagnosis: Other symptoms and signs involving the musculoskeletal system  Acute pain of right shoulder  Stiffness of right shoulder, not elsewhere classified    Problem List Patient Active Problem List   Diagnosis Date Noted  . Essential hypertension 09/11/2017  . Chronic kidney disease 09/11/2017  . Anemia 09/11/2017  . Prediabetes 09/11/2017   Ailene Ravel, OTR/L,CBIS  [phone]  10/29/2017, 10:34 AM  [facility] [place] [address] [place], [place], [address] Phone: [phone]   Fax:  [phone]  Name: Melissa Solis MRN: [id number] Date of Birth: Dec 06, 1955

## 2017-10-30 NOTE — Congregational Nurse Program (Signed)
Congregational Nurse Program Note  Date of Encounter: 10/30/2017  Past Medical History: Past Medical History:  Diagnosis Date  . Arthritis   . Chronic back pain   . Chronic shoulder pain   . Depression   . GERD (gastroesophageal reflux disease)   . Hypertension     Encounter Details: CNP Questionnaire - 10/30/17 1120      Questionnaire   Patient Status  Not Applicable    Race  Black or African American    Location Patient Served At  [employer], [employer]  No food insecurities    Housing/Utilities  Yes, have permanent housing    Transportation  No transportation needs    Interpersonal Safety  Yes, feel physically and emotionally safe where you currently live    Medication  No medication insecurities    Medical Provider  Yes    Referrals  Not Applicable    ED Visit Averted  Not Applicable    Life-Saving Intervention Made  Not Applicable     Seen at the [facility]. No complaints B/P 125/84; P [address] [place], [place] [facility] [phone]

## 2017-11-04 ENCOUNTER — Encounter (HOSPITAL_COMMUNITY): Payer: Self-pay | Admitting: Occupational Therapy

## 2017-11-04 ENCOUNTER — Ambulatory Visit (HOSPITAL_COMMUNITY): Payer: Self-pay | Admitting: Occupational Therapy

## 2017-11-04 ENCOUNTER — Encounter

## 2017-11-04 DIAGNOSIS — M25511 Pain in right shoulder: Secondary | ICD-10-CM

## 2017-11-04 DIAGNOSIS — M25611 Stiffness of right shoulder, not elsewhere classified: Secondary | ICD-10-CM

## 2017-11-04 DIAGNOSIS — R29898 Other symptoms and signs involving the musculoskeletal system: Secondary | ICD-10-CM

## 2017-11-04 NOTE — Therapy (Signed)
[place], [place], [address] Phone: [phone]   Fax:  [phone]  Occupational Therapy Treatment  Patient Details  Name: Melissa Solis MRN: [id number] Date of Birth: [birth year] Referring Provider: Soyla Dryer PA-C   Encounter Date: 11/04/2017  OT End of Session - 11/04/17 1045    Visit Number  12    Number of Visits  16    Date for OT Re-Evaluation  12/24/17 mini-reassessment 11/22/17    Authorization Type  [facility] approval. Effective 09/24/17-03/27/18    OT Start Time  1030    OT Stop Time  1110    OT Time Calculation (min)  40 min    Activity Tolerance  Patient tolerated treatment well    Behavior During Therapy  WFL for tasks assessed/performed       Past Medical History:  Diagnosis Date  . Arthritis   . Chronic back pain   . Chronic shoulder pain   . Depression   . GERD (gastroesophageal reflux disease)   . Hypertension     Past Surgical History:  Procedure Laterality Date  . FOOT FRACTURE SURGERY Left   . REPLACEMENT TOTAL KNEE BILATERAL  2011  . ROTATOR CUFF REPAIR Right   . SPINAL FUSION      There were no vitals filed for this visit.  Subjective Assessment - 11/04/17 1032    Subjective   S: I'm sore from the last session.     Currently in Pain?  Yes    Pain Score  4     Pain Location  Shoulder    Pain Orientation  Left    Pain Descriptors / Indicators  Aching    Pain Type  Acute pain    Pain Radiating Towards  around scapula    Pain Onset  In the past 7 days    Pain Frequency  Constant    Aggravating Factors   movement and use    Pain Relieving Factors  ice, heat, pain medication    Effect of Pain on Daily Activities  min effect, pushes through to complete tasks    Multiple Pain Sites  No         [facility] OT Assessment - 11/04/17 1032      Assessment   Medical Diagnosis  right RTC repair      Precautions   Precautions  None    Precaution Comments  progress as  tolerated               OT Treatments/Exercises (OP) - 11/04/17 1032      Exercises   Exercises  Shoulder      Shoulder Exercises: Supine   Protraction  PROM;5 reps;Strengthening;15 reps    Protraction Weight (lbs)  1    Horizontal ABduction  PROM;5 reps;Strengthening;15 reps    Horizontal ABduction Weight (lbs)  1    External Rotation  PROM;5 reps;Strengthening;15 reps    External Rotation Weight (lbs)  1    Internal Rotation  PROM;5 reps;Strengthening;15 reps    Internal Rotation Weight (lbs)  1    Flexion  PROM;5 reps;Strengthening;15 reps    Shoulder Flexion Weight (lbs)  1    ABduction  PROM;5 reps;Strengthening;15 reps    Shoulder ABduction Weight (lbs)  1      Shoulder Exercises: Standing   Protraction  Strengthening;10 reps    Protraction Weight (lbs)  1    Horizontal ABduction  AROM;10 reps    External Rotation  Strengthening;10  reps;Theraband;12 reps    Theraband Level (Shoulder External Rotation)  Level 2 (Red)    External Rotation Weight (lbs)  1    Internal Rotation  Strengthening;10 reps    Internal Rotation Weight (lbs)  1    Flexion  Strengthening;10 reps    Shoulder Flexion Weight (lbs)  1    ABduction  Strengthening;10 reps    Shoulder ABduction Weight (lbs)  1    Extension  Theraband;15 reps    Theraband Level (Shoulder Extension)  Level 2 (Red)    Row  Theraband;15 reps    Theraband Level (Shoulder Row)  Level 2 (Red)    Retraction  Theraband;15 reps    Theraband Level (Shoulder Retraction)  Level 2 (Red)      Shoulder Exercises: Therapy Ball   Other Therapy Ball Exercises  green ball: chest press, overhead press, 10X each      Shoulder Exercises: ROM/Strengthening   "W" Arms  10X    X to V Arms  10X    Proximal Shoulder Strengthening, Supine  12X with 1# no rest breaks    Proximal Shoulder Strengthening, Seated  12X with 1 rest breaks    Ball on Wall  1' flexion    Other ROM/Strengthening Exercises  Y arm lift off from wall; 10X     Other ROM/Strengthening Exercises  horizontal abduction with serratus anterior, 5X red band      Manual Therapy   Manual Therapy  Myofascial release    Manual therapy comments  Manual therapy completed prior to exercises.     Myofascial Release  Myofascial release and manual stretching completed to right upper arm, trapezius, and scapularis region to decrease pain and restrictions that are prohibiting end range mobiilty.                 OT Short Term Goals - 10/29/17 1033      OT SHORT TERM GOAL #1   Title  Patient will be educated and independent with HEP to faciliate progress in therapy and allow her to return to using her RUE for all daily tasks.     Time  3    Period  Weeks    Status  On-going      OT SHORT TERM GOAL #2   Title  Patient will increase P/ROM to WNL to increase ability to get shirts on and off with less difficulty.     Time  3    Period  Weeks      OT SHORT TERM GOAL #3   Title  Patient will increase RUE strength to 4-/5 to increase ability to complete activities at shoulder level with less difficulty.     Time  3    Period  Weeks    Status  Partially Met      OT SHORT TERM GOAL #4   Title  Patient will decrease pain level in RUE to approximately 3/10 or less when completing daily tasks.     Time  3    Period  Weeks    Status  On-going      OT SHORT TERM GOAL #5   Title  Patient will decrease fascial restrictions to min amount to increase functional mobility needed to fix and brush her hair using her RUE.     Time  3    Period  Weeks    Status  On-going        OT Long Term Goals - 09/26/17 1103  OT LONG TERM GOAL #1   Title  Patient will return to highest level of independence with daily and leisure tasks while using her right UE as dominant extremity 100% of the time.    Time  6    Period  Weeks    Status  On-going      OT LONG TERM GOAL #2   Title  Patient will increase A/ROM to WNL to increase ability to reach overhead and out to  the side with less difficulty.     Time  6    Period  Weeks    Status  On-going      OT LONG TERM GOAL #3   Title  Patient will increase RUE strength to 4+/5 to increase ability to complete heavy household chores and lifting tasks.     Time  6    Period  Weeks    Status  On-going      OT LONG TERM GOAL #4   Title  Patient will decrease RUE pain to 2/10 or less when completing daily tasks.     Time  6    Period  Weeks    Status  On-going            Plan - 11/04/17 1111    Clinical Impression Statement  A: Manual therapy completed to upper arm and anterior deltoid, minimal restrictions palpated with mild muscle tightness. Pt with occasional pinching/catching sensation during abduction, however was able to complete exercises without difficulty. Rest breaks provided as needed, added x to v arms, w arms, ball on wall, and green therapy ball exercises. Verbal cuing for form and technique during exercises.     Plan  P: Discharge manual therapy at this time, continue with strengthening and functional task completion, attempt theraband strengthening in standing versus using weights       Patient will benefit from skilled therapeutic intervention in order to improve the following deficits and impairments:  Decreased activity tolerance, Decreased strength, Decreased range of motion, Pain, Impaired UE functional use, Increased fascial restrictions  Visit Diagnosis: Other symptoms and signs involving the musculoskeletal system  Acute pain of right shoulder  Stiffness of right shoulder, not elsewhere classified    Problem List Patient Active Problem List   Diagnosis Date Noted  . Essential hypertension 09/11/2017  . Chronic kidney disease 09/11/2017  . Anemia 09/11/2017  . Prediabetes 09/11/2017   Guadelupe Sabin, OTR/L  [phone] 11/04/2017, 11:15 AM  [place] [address] [place], [place], [address] Phone: [phone]   Fax:   [phone]  Name: Jarelyn Bambach MRN: [id number] Date of Birth: 08-09-1955

## 2017-11-04 NOTE — Congregational Nurse Program (Signed)
Congregational Nurse Program Note  Date of Encounter: 10/15/2017  Past Medical History: Past Medical History:  Diagnosis Date  . Arthritis   . Chronic back pain   . Chronic shoulder pain   . Depression   . GERD (gastroesophageal reflux disease)   . 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 B/P 125/92; p [address] RN, [place] [facility] [phone]

## 2017-11-06 ENCOUNTER — Encounter (HOSPITAL_COMMUNITY): Payer: Self-pay

## 2017-11-06 ENCOUNTER — Other Ambulatory Visit: Payer: Self-pay

## 2017-11-06 ENCOUNTER — Ambulatory Visit (HOSPITAL_COMMUNITY): Payer: Self-pay

## 2017-11-06 DIAGNOSIS — M25611 Stiffness of right shoulder, not elsewhere classified: Secondary | ICD-10-CM

## 2017-11-06 DIAGNOSIS — M25511 Pain in right shoulder: Secondary | ICD-10-CM

## 2017-11-06 DIAGNOSIS — R29898 Other symptoms and signs involving the musculoskeletal system: Secondary | ICD-10-CM

## 2017-11-06 NOTE — Therapy (Signed)
[place], [place], [address] Phone: [phone]   Fax:  [phone]  Occupational Therapy Treatment  Patient Details  Name: Melissa Solis MRN: [id number] Date of Birth: May 27, 1956 Referring Provider: Soyla Dryer PA-C   Encounter Date: 11/06/2017  OT End of Session - 11/06/17 1359    Visit Number  13    Number of Visits  16    Date for OT Re-Evaluation  12/24/17 mini-reassessment 11/22/17    Authorization Type  [facility] approval. Effective 09/24/17-03/27/18    OT Start Time  1120    OT Stop Time  1200    OT Time Calculation (min)  40 min    Activity Tolerance  Patient tolerated treatment well    Behavior During Therapy  WFL for tasks assessed/performed       Past Medical History:  Diagnosis Date  . Arthritis   . Chronic back pain   . Chronic shoulder pain   . Depression   . GERD (gastroesophageal reflux disease)   . Hypertension     Past Surgical History:  Procedure Laterality Date  . FOOT FRACTURE SURGERY Left   . REPLACEMENT TOTAL KNEE BILATERAL  2011  . ROTATOR CUFF REPAIR Right   . SPINAL FUSION      There were no vitals filed for this visit.  Subjective Assessment - 11/06/17 1142    Subjective   S: I'm sore in this one spot.     Currently in Pain?  Yes    Pain Score  4     Pain Location  Shoulder    Pain Orientation  Left    Pain Descriptors / Indicators  Aching    Pain Type  Acute pain         [facility] OT Assessment - 11/06/17 1358      Assessment   Medical Diagnosis  right RTC repair      Precautions   Precautions  None    Precaution Comments  progress as tolerated               OT Treatments/Exercises (OP) - 11/06/17 1138      Exercises   Exercises  Shoulder      Shoulder Exercises: Supine   Protraction  PROM;5 reps;Strengthening;10 reps    Protraction Weight (lbs)  2    Horizontal ABduction  PROM;5 reps;Strengthening;10 reps    Horizontal ABduction  Weight (lbs)  2    External Rotation  PROM;5 reps;Strengthening;10 reps    External Rotation Weight (lbs)  2    Internal Rotation  PROM;5 reps;Strengthening;10 reps    Internal Rotation Weight (lbs)  2    Flexion  PROM;5 reps;Strengthening;10 reps    Shoulder Flexion Weight (lbs)  2    ABduction  PROM;5 reps;Strengthening;10 reps    Shoulder ABduction Weight (lbs)  2      Shoulder Exercises: Standing   Horizontal ABduction  Theraband;12 reps    Theraband Level (Shoulder Horizontal ABduction)  Level 2 (Red)    External Rotation  Theraband;12 reps    Theraband Level (Shoulder External Rotation)  Level 2 (Red)    Internal Rotation  Theraband;12 reps    Theraband Level (Shoulder Internal Rotation)  Level 2 (Red)    Other Standing Exercises  Theraband, Adduction, 12X, red band      Shoulder Exercises: ROM/Strengthening   UBE (Upper Arm Bike)  level 3 2' forward 2' reverse pace: 3.5-4.0    X to V Arms  10X with 1#    Proximal Shoulder Strengthening, Supine  12X with 2# no rest breaks    Other ROM/Strengthening Exercises  wall slides with red band; 5X each direction             OT Education - 11/06/17 1358    Education provided  Yes    Education Details  Pt was educated to continue completing her 3 scapular theraband strengthening exercises and using a 1# weight or water bottle for her standing strengthening exercises.     Person(s) Educated  Patient    Methods  Explanation    Comprehension  Verbalized understanding       OT Short Term Goals - 10/29/17 1033      OT SHORT TERM GOAL #1   Title  Patient will be educated and independent with HEP to faciliate progress in therapy and allow her to return to using her RUE for all daily tasks.     Time  3    Period  Weeks    Status  On-going      OT SHORT TERM GOAL #2   Title  Patient will increase P/ROM to WNL to increase ability to get shirts on and off with less difficulty.     Time  3    Period  Weeks      OT SHORT TERM  GOAL #3   Title  Patient will increase RUE strength to 4-/5 to increase ability to complete activities at shoulder level with less difficulty.     Time  3    Period  Weeks    Status  Partially Met      OT SHORT TERM GOAL #4   Title  Patient will decrease pain level in RUE to approximately 3/10 or less when completing daily tasks.     Time  3    Period  Weeks    Status  On-going      OT SHORT TERM GOAL #5   Title  Patient will decrease fascial restrictions to min amount to increase functional mobility needed to fix and brush her hair using her RUE.     Time  3    Period  Weeks    Status  On-going        OT Long Term Goals - 09/26/17 1103      OT LONG TERM GOAL #1   Title  Patient will return to highest level of independence with daily and leisure tasks while using her right UE as dominant extremity 100% of the time.    Time  6    Period  Weeks    Status  On-going      OT LONG TERM GOAL #2   Title  Patient will increase A/ROM to WNL to increase ability to reach overhead and out to the side with less difficulty.     Time  6    Period  Weeks    Status  On-going      OT LONG TERM GOAL #3   Title  Patient will increase RUE strength to 4+/5 to increase ability to complete heavy household chores and lifting tasks.     Time  6    Period  Weeks    Status  On-going      OT LONG TERM GOAL #4   Title  Patient will decrease RUE pain to 2/10 or less when completing daily tasks.     Time  6    Period  Weeks  Status  On-going            Plan - 11/06/17 1359    Clinical Impression Statement  A: Focused on strengthening using hand weights and theraband this session. patient had maximum difficulty with shoulder flexion and abduction while standing with theraband. Exercises were omitted due to lack of form and technique. VC were needed for all exercises.      Plan  P: Update HEP to include only theraband strengthening for shoulder and scapula.     Consulted and Agree with Plan  of Care  Patient       Patient will benefit from skilled therapeutic intervention in order to improve the following deficits and impairments:  Decreased activity tolerance, Decreased strength, Decreased range of motion, Pain, Impaired UE functional use, Increased fascial restrictions  Visit Diagnosis: Other symptoms and signs involving the musculoskeletal system  Acute pain of right shoulder  Stiffness of right shoulder, not elsewhere classified    Problem List Patient Active Problem List   Diagnosis Date Noted  . Essential hypertension 09/11/2017  . Chronic kidney disease 09/11/2017  . Anemia 09/11/2017  . Prediabetes 09/11/2017   Ailene Ravel, OTR/L,CBIS  [phone]  11/06/2017, 2:01 PM  [facility] [address] [place], [place], [address] Phone: [phone]   Fax:  [phone]  Name: Melissa Solis MRN: [id number] Date of Birth: [birth year]

## 2017-11-10 ENCOUNTER — Encounter (HOSPITAL_COMMUNITY): Payer: Self-pay

## 2017-11-10 ENCOUNTER — Ambulatory Visit (HOSPITAL_COMMUNITY): Payer: Self-pay

## 2017-11-10 DIAGNOSIS — M25511 Pain in right shoulder: Secondary | ICD-10-CM

## 2017-11-10 DIAGNOSIS — R29898 Other symptoms and signs involving the musculoskeletal system: Secondary | ICD-10-CM

## 2017-11-10 DIAGNOSIS — M25611 Stiffness of right shoulder, not elsewhere classified: Secondary | ICD-10-CM

## 2017-11-10 NOTE — Patient Instructions (Addendum)
Complete the following 12-15 repetitions. 1-2 times a day.   Strengthening: Chest Pull - Resisted   Hold Theraband in front of body with hands about shoulder width a part. Pull band a part and back together slowly. Repeat ____ times. Complete ____ set(s) per session.. Repeat ____ session(s) per day.  http://orth.exer.us/926      Resisted External Rotation: in Neutral - Bilateral   Sit or stand, tubing in both hands, elbows at sides, bent to 90, forearms forward. Pinch shoulder blades together and rotate forearms out. Keep elbows at sides. Repeat ____ times per set. Do ____ sets per session. Do ____ sessions per day.  http://orth.exer.us/966   Copyright  VHI. All rights reserved.   PNF Strengthening: Resisted   Standing, hold resistive band above head. Bring right arm down and out from side. Repeat ____ times per set. Do ____ sets per session. Do ____ sessions per day.  http://orth.exer.us/922   Copyright  VHI. All rights reserved.

## 2017-11-10 NOTE — Therapy (Signed)
[facility], [place], [address] Phone: [phone]   Fax:  [phone]  Occupational Therapy Treatment  Patient Details  Name: Melissa Solis MRN: [id number] Date of Birth: 01-May-1956 Referring Provider: Soyla Dryer PA-C   Encounter Date: 11/10/2017  OT End of Session - 11/10/17 1153    Visit Number  14    Number of Visits  16    Date for OT Re-Evaluation  12/24/17 mini-reassessment 11/22/17    Authorization Type  [facility] Charity approval. Effective 09/24/17-03/27/18    OT Start Time  1125    OT Stop Time  1203    OT Time Calculation (min)  38 min    Activity Tolerance  Patient tolerated treatment well    Behavior During Therapy  WFL for tasks assessed/performed       Past Medical History:  Diagnosis Date  . Arthritis   . Chronic back pain   . Chronic shoulder pain   . Depression   . GERD (gastroesophageal reflux disease)   . Hypertension     Past Surgical History:  Procedure Laterality Date  . FOOT FRACTURE SURGERY Left   . REPLACEMENT TOTAL KNEE BILATERAL  2011  . ROTATOR CUFF REPAIR Right   . SPINAL FUSION      There were no vitals filed for this visit.  Subjective Assessment - 11/10/17 1141    Subjective   S: I'm still sore in the one spot.     Currently in Pain?  Yes    Pain Score  3     Pain Location  Shoulder    Pain Orientation  Left    Pain Descriptors / Indicators  Aching    Pain Type  Acute pain         [facility] OT Assessment - 11/10/17 1142      Assessment   Medical Diagnosis  right RTC repair      Precautions   Precautions  None    Precaution Comments  progress as tolerated               OT Treatments/Exercises (OP) - 11/10/17 1142      Exercises   Exercises  Shoulder      Shoulder Exercises: Supine   Protraction  PROM;5 reps;Strengthening;10 reps    Protraction Weight (lbs)  2    Horizontal ABduction  PROM;5 reps;Strengthening;10 reps    Horizontal ABduction  Weight (lbs)  2    External Rotation  PROM;5 reps;Strengthening;10 reps    External Rotation Weight (lbs)  2    Internal Rotation  PROM;5 reps;Strengthening;10 reps    Internal Rotation Weight (lbs)  2    Flexion  PROM;5 reps;Strengthening;10 reps    Shoulder Flexion Weight (lbs)  2    ABduction  PROM;5 reps;Strengthening;10 reps    Shoulder ABduction Weight (lbs)  2      Shoulder Exercises: Standing   Horizontal ABduction  Theraband;12 reps    Theraband Level (Shoulder Horizontal ABduction)  Level 2 (Red)    External Rotation  Theraband;12 reps    Theraband Level (Shoulder External Rotation)  Level 2 (Red)    Internal Rotation  Theraband;12 reps    Theraband Level (Shoulder Internal Rotation)  Level 2 (Red)    Other Standing Exercises  Theraband, Adduction, 12X, red band      Shoulder Exercises: ROM/Strengthening   UBE (Upper Arm Bike)  level 3 2' forward 2' reverse pace: 3.5-4.0    X to V Arms  10X with 1#    Proximal Shoulder Strengthening, Supine  --    Proximal Shoulder Strengthening, Seated  12X with 2# rest break after each one    Ball on Wall  1' flexion 1' abduction green ball      Manual Therapy   Manual Therapy  Myofascial release    Manual therapy comments  Manual therapy completed prior to exercises.     Myofascial Release  Myofascial release and manual stretching completed to right upper arm, trapezius, and scapularis region to decrease pain and restrictions that are prohibiting end range mobiilty.               OT Education - 11/10/17 1216    Education provided  Yes    Education Details  3 red theraband shoulder strengthening exercises. Patient was educated to also complete 3 scapular theraband exercises that were previously given for HEP.     Person(s) Educated  Patient    Methods  Explanation;Demonstration;Verbal cues;Handout    Comprehension  Returned demonstration;Verbalized understanding       OT Short Term Goals - 10/29/17 1033      OT SHORT TERM  GOAL #1   Title  Patient will be educated and independent with HEP to faciliate progress in therapy and allow her to return to using her RUE for all daily tasks.     Time  3    Period  Weeks    Status  On-going      OT SHORT TERM GOAL #2   Title  Patient will increase P/ROM to WNL to increase ability to get shirts on and off with less difficulty.     Time  3    Period  Weeks      OT SHORT TERM GOAL #3   Title  Patient will increase RUE strength to 4-/5 to increase ability to complete activities at shoulder level with less difficulty.     Time  3    Period  Weeks    Status  Partially Met      OT SHORT TERM GOAL #4   Title  Patient will decrease pain level in RUE to approximately 3/10 or less when completing daily tasks.     Time  3    Period  Weeks    Status  On-going      OT SHORT TERM GOAL #5   Title  Patient will decrease fascial restrictions to min amount to increase functional mobility needed to fix and brush her hair using her RUE.     Time  3    Period  Weeks    Status  On-going        OT Long Term Goals - 09/26/17 1103      OT LONG TERM GOAL #1   Title  Patient will return to highest level of independence with daily and leisure tasks while using her right UE as dominant extremity 100% of the time.    Time  6    Period  Weeks    Status  On-going      OT LONG TERM GOAL #2   Title  Patient will increase A/ROM to WNL to increase ability to reach overhead and out to the side with less difficulty.     Time  6    Period  Weeks    Status  On-going      OT LONG TERM GOAL #3   Title  Patient will increase RUE strength to 4+/5 to increase ability  to complete heavy household chores and lifting tasks.     Time  6    Period  Weeks    Status  On-going      OT LONG TERM GOAL #4   Title  Patient will decrease RUE pain to 2/10 or less when completing daily tasks.     Time  6    Period  Weeks    Status  On-going            Plan - 11/10/17 1217    Clinical  Impression Statement  A: Patient was provided with an updated HEP this session. Arrives today with a moderate amount of fascial restrictions in the right upper shoulder region with manual therapy completed to address. Full passive ROM this date. VC for form and technique needed. Patient required frequent rest breaks due to muscle fatigue.     Plan  P: Follow up on updated HEP. D/C passive stretching. Complete myofascial release as needed to address pain and fascial restrictions. Continue with strengthening. Add therapy ball shoulder strengthening.     Consulted and Agree with Plan of Care  Patient       Patient will benefit from skilled therapeutic intervention in order to improve the following deficits and impairments:  Decreased activity tolerance, Decreased strength, Decreased range of motion, Pain, Impaired UE functional use, Increased fascial restrictions  Visit Diagnosis: Other symptoms and signs involving the musculoskeletal system  Acute pain of right shoulder  Stiffness of right shoulder, not elsewhere classified    Problem List Patient Active Problem List   Diagnosis Date Noted  . Essential hypertension 09/11/2017  . Chronic kidney disease 09/11/2017  . Anemia 09/11/2017  . Prediabetes 09/11/2017   Ailene Ravel, OTR/L,CBIS  [phone]  11/10/2017, 12:20 PM  [facility] [address] [place], [place], [address] Phone: [phone]   Fax:  [phone]  Name: Walburga Hudman MRN: [id number] Date of Birth: 1956/05/28

## 2017-11-13 ENCOUNTER — Ambulatory Visit: Payer: Medicaid - Out of State | Admitting: Physician Assistant

## 2017-11-13 ENCOUNTER — Encounter (HOSPITAL_COMMUNITY): Payer: Self-pay | Admitting: Occupational Therapy

## 2017-11-13 ENCOUNTER — Encounter: Payer: Self-pay | Admitting: Physician Assistant

## 2017-11-13 ENCOUNTER — Ambulatory Visit (HOSPITAL_COMMUNITY): Payer: Self-pay | Admitting: Occupational Therapy

## 2017-11-13 VITALS — BP 105/75 | HR 82 | Ht 65.75 in | Wt 218.0 lb

## 2017-11-13 DIAGNOSIS — M25611 Stiffness of right shoulder, not elsewhere classified: Secondary | ICD-10-CM

## 2017-11-13 DIAGNOSIS — N189 Chronic kidney disease, unspecified: Secondary | ICD-10-CM

## 2017-11-13 DIAGNOSIS — D649 Anemia, unspecified: Secondary | ICD-10-CM

## 2017-11-13 DIAGNOSIS — M25511 Pain in right shoulder: Secondary | ICD-10-CM

## 2017-11-13 DIAGNOSIS — I10 Essential (primary) hypertension: Secondary | ICD-10-CM

## 2017-11-13 DIAGNOSIS — R29898 Other symptoms and signs involving the musculoskeletal system: Secondary | ICD-10-CM

## 2017-11-13 NOTE — Progress Notes (Signed)
BP 105/75 (BP Location: Left Arm, Patient Position: Sitting, Cuff Size: Normal)   Pulse 82   Ht 5' 5.75" (1.67 m)   Wt 218 lb (98.9 kg)   SpO2 99%   BMI 35.45 kg/m    Subjective:    Patient ID: Melissa Solis, female    DOB: 03-16-1956, 62 y.o.   MRN: [id number]  HPI: Melissa Solis is a 62 y.o. female presenting on 11/13/2017 for Hypertension   HPI   Pt has appointment with orthopedics next week  She says she is feeling well today  Relevant past medical, surgical, family and social history reviewed and updated as indicated. Interim medical history since our last visit reviewed. Allergies and medications reviewed and updated.   Current Outpatient Medications:  .  DOCUSATE SODIUM PO, Take 1 tablet by mouth as needed., Disp: , Rfl:  .  hydrochlorothiazide (MICROZIDE) 12.5 MG capsule, Take 1 capsule (12.5 mg total) by mouth daily., Disp: 30 capsule, Rfl: 1 .  metoprolol tartrate (LOPRESSOR) 100 MG tablet, Take 1 tablet (100 mg total) by mouth 2 (two) times daily., Disp: 180 tablet, Rfl: 1 .  nabumetone (RELAFEN) 500 MG tablet, Take 500 mg by mouth 2 (two) times daily as needed for moderate pain. , Disp: , Rfl:  .  omeprazole (PRILOSEC) 40 MG capsule, Take 1 capsule (40 mg total) by mouth daily., Disp: 90 capsule, Rfl: 1 .  cetirizine (ZYRTEC) 10 MG tablet, Take 10 mg by mouth daily., Disp: , Rfl:  .  Cholecalciferol (VITAMIN D3) 2000 units TABS, Take 1 tablet by mouth daily., Disp: , Rfl:  .  HYDROcodone-acetaminophen (NORCO/VICODIN) 5-325 MG tablet, Take 1 tablet by mouth every 6 (six) hours as needed for moderate pain., Disp: , Rfl:  .  irbesartan (AVAPRO) 150 MG tablet, Take 1 tablet (150 mg total) by mouth daily., Disp: 30 tablet, Rfl: 1 .  Iron Combinations (IRON COMPLEX) CAPS, daily (Patient not taking: Reported on 10/02/2017), Disp: , Rfl:  .  traMADol (ULTRAM) 50 MG tablet, Take 1 tablet (50 mg total) by mouth every 6 (six) hours as needed. (Patient not taking: Reported on  11/13/2017), Disp: 15 tablet, Rfl: 0   Review of Systems  Constitutional: Positive for appetite change, fatigue and unexpected weight change. Negative for chills, diaphoresis and fever.  HENT: Negative for congestion, dental problem, drooling, ear pain, facial swelling, hearing loss, mouth sores, sneezing, sore throat, trouble swallowing and voice change.   Eyes: Negative for pain, discharge, redness, itching and visual disturbance.  Respiratory: Negative for cough, choking, shortness of breath and wheezing.   Cardiovascular: Negative for chest pain, palpitations and leg swelling.  Gastrointestinal: Negative for abdominal pain, blood in stool, constipation, diarrhea and vomiting.  Endocrine: Negative for cold intolerance, heat intolerance and polydipsia.  Genitourinary: Negative for decreased urine volume, dysuria and hematuria.  Musculoskeletal: Positive for arthralgias and gait problem. Negative for back pain.  Skin: Negative for rash.  Allergic/Immunologic: Negative for environmental allergies.  Neurological: Negative for seizures, syncope, light-headedness and headaches.  Hematological: Negative for adenopathy.  Psychiatric/Behavioral: Negative for agitation, dysphoric mood and suicidal ideas. The patient is not nervous/anxious.     Per HPI unless specifically indicated above     Objective:    BP 105/75 (BP Location: Left Arm, Patient Position: Sitting, Cuff Size: Normal)   Pulse 82   Ht 5' 5.75" (1.67 m)   Wt 218 lb (98.9 kg)   SpO2 99%   BMI 35.45 kg/m   Wt Readings from Last  3 Encounters:  11/13/17 218 lb (98.9 kg)  10/28/17 219 lb (99.3 kg)  10/28/17 219 lb (99.3 kg)    Physical Exam  Constitutional: She is oriented to person, place, and time. She appears well-developed and well-nourished.  HENT:  Head: Normocephalic and atraumatic.  Neck: Neck supple.  Cardiovascular: Normal rate and regular rhythm.  Pulmonary/Chest: Effort normal and breath sounds normal.   Abdominal: Soft. Bowel sounds are normal. She exhibits no mass. There is no hepatosplenomegaly. There is no tenderness.  Musculoskeletal: She exhibits no edema.  Lymphadenopathy:    She has no cervical adenopathy.  Neurological: She is alert and oriented to person, place, and time.  Skin: Skin is warm and dry.  Psychiatric: She has a normal mood and affect. Her behavior is normal.  Vitals reviewed.       Assessment & Plan:    Encounter Diagnoses  Name Primary?  . Essential hypertension Yes  . Anemia, unspecified type   . Chronic kidney disease, unspecified CKD stage     -bp good on hctz and metoprolol.  Does not need the ARB -pt to Follow up 3 months.  RTO sooner prn

## 2017-11-13 NOTE — Therapy (Signed)
[facility] [place], [place], [address] Phone: [phone]   Fax:  [phone]  Occupational Therapy Treatment  Patient Details  Name: Melissa Solis MRN: [id number] Date of Birth: Oct 22, 1955 Referring Provider: Soyla Dryer PA-C   Encounter Date: 11/13/2017  OT End of Session - 11/13/17 1357    Visit Number  15    Number of Visits  16    Date for OT Re-Evaluation  12/24/17 mini-reassessment 11/22/17    Authorization Type  [facility] Char[facility] approval. Effective 09/24/17-03/27/18    OT Start Time  1305    OT Stop Time  1343    OT Time Calculation (min)  38 min    Activity Tolerance  Patient tolerated treatment well    Behavior During Therapy  WFL for tasks assessed/performed       Past Medical History:  Diagnosis Date  . Arthritis   . Chronic back pain   . Chronic shoulder pain   . Depression   . GERD (gastroesophageal reflux disease)   . Hypertension     Past Surgical History:  Procedure Laterality Date  . FOOT FRACTURE SURGERY Left   . REPLACEMENT TOTAL KNEE BILATERAL  2011  . ROTATOR CUFF REPAIR Right   . SPINAL FUSION      There were no vitals filed for this visit.  Subjective Assessment - 11/13/17 1306    Subjective   S: My shoulder is just sore.     Currently in Pain?  Yes    Pain Score  3     Pain Location  Shoulder    Pain Orientation  Right    Pain Descriptors / Indicators  Aching;Sore    Pain Type  Acute pain    Pain Radiating Towards  n/a    Pain Onset  1 to 4 weeks ago    Pain Frequency  Constant    Aggravating Factors   movement, use    Pain Relieving Factors  ice, heat, pain medication    Effect of Pain on Daily Activities  min effect    Multiple Pain Sites  No         [facility] OT Assessment - 11/13/17 1306      Assessment   Medical Diagnosis  right RTC repair      Precautions   Precautions  None    Precaution Comments  progress as tolerated               OT  Treatments/Exercises (OP) - 11/13/17 1306      Exercises   Exercises  Shoulder      Shoulder Exercises: Supine   Protraction  PROM;5 reps;Strengthening;12 reps    Protraction Weight (lbs)  2    Horizontal ABduction  PROM;5 reps;Strengthening;12 reps    Horizontal ABduction Weight (lbs)  2    External Rotation  PROM;5 reps;Strengthening;12 reps    External Rotation Weight (lbs)  2    Internal Rotation  PROM;5 reps;Strengthening;12 reps    Internal Rotation Weight (lbs)  2    Flexion  PROM;5 reps;Strengthening;12 reps    Shoulder Flexion Weight (lbs)  2    ABduction  PROM;5 reps;Strengthening;12 reps    Shoulder ABduction Weight (lbs)  2      Shoulder Exercises: Standing   Horizontal ABduction  Theraband;12 reps    Theraband Level (Shoulder Horizontal ABduction)  Level 2 (Red)    External Rotation  Theraband;12 reps    Theraband Level (Shoulder External Rotation)  Level  2 (Red)    Internal Rotation  Theraband;12 reps    Theraband Level (Shoulder Internal Rotation)  Level 2 (Red)      Shoulder Exercises: Therapy Ball   Other Therapy Ball Exercises  green ball: chest press, overhead press, flexion, 10X each      Shoulder Exercises: ROM/Strengthening   UBE (Upper Arm Bike)  level 3 2' forward 2' reverse pace 4.o-5.0    Over Head Lace  1'    Proximal Shoulder Strengthening, Supine  12X with 2# no rest breaks    Ball on Wall  1' flexion 1' abduction green ball      Manual Therapy   Manual Therapy  Myofascial release    Manual therapy comments  Manual therapy completed prior to exercises.     Myofascial Release  Myofascial release and manual stretching completed to right upper arm, trapezius, and scapularis region to decrease pain and restrictions that are prohibiting end range mobiilty.                 OT Short Term Goals - 10/29/17 1033      OT SHORT TERM GOAL #1   Title  Patient will be educated and independent with HEP to faciliate progress in therapy and allow her  to return to using her RUE for all daily tasks.     Time  3    Period  Weeks    Status  On-going      OT SHORT TERM GOAL #2   Title  Patient will increase P/ROM to WNL to increase ability to get shirts on and off with less difficulty.     Time  3    Period  Weeks      OT SHORT TERM GOAL #3   Title  Patient will increase RUE strength to 4-/5 to increase ability to complete activities at shoulder level with less difficulty.     Time  3    Period  Weeks    Status  Partially Met      OT SHORT TERM GOAL #4   Title  Patient will decrease pain level in RUE to approximately 3/10 or less when completing daily tasks.     Time  3    Period  Weeks    Status  On-going      OT SHORT TERM GOAL #5   Title  Patient will decrease fascial restrictions to min amount to increase functional mobility needed to fix and brush her hair using her RUE.     Time  3    Period  Weeks    Status  On-going        OT Long Term Goals - 09/26/17 1103      OT LONG TERM GOAL #1   Title  Patient will return to highest level of independence with daily and leisure tasks while using her right UE as dominant extremity 100% of the time.    Time  6    Period  Weeks    Status  On-going      OT LONG TERM GOAL #2   Title  Patient will increase A/ROM to WNL to increase ability to reach overhead and out to the side with less difficulty.     Time  6    Period  Weeks    Status  On-going      OT LONG TERM GOAL #3   Title  Patient will increase RUE strength to 4+/5 to increase ability to complete heavy  household chores and lifting tasks.     Time  6    Period  Weeks    Status  On-going      OT LONG TERM GOAL #4   Title  Patient will decrease RUE pain to 2/10 or less when completing daily tasks.     Time  6    Period  Weeks    Status  On-going            Plan - 11/13/17 1357    Clinical Impression Statement  A: Pt reports HEP is going well. Continued with manual therapy today to address fascial restrictions  in right upper arm and deltoid. Continued with shoulder strengthening in supine, resumed green therapy ball strengthening in sitting as well as scapular stabilization exercises. Verbal cuing for form and technique, rest breaks as needed for fatigue. Pt reports she has appt with othopedic MD soon.     Plan  P: myofascial release prn, no passive stretching. Continue with strengthening resuming seated with weights or red loop band       Patient will benefit from skilled therapeutic intervention in order to improve the following deficits and impairments:  Decreased activity tolerance, Decreased strength, Decreased range of motion, Pain, Impaired UE functional use, Increased fascial restrictions  Visit Diagnosis: Other symptoms and signs involving the musculoskeletal system  Acute pain of right shoulder  Stiffness of right shoulder, not elsewhere classified    Problem List Patient Active Problem List   Diagnosis Date Noted  . Essential hypertension 09/11/2017  . Chronic kidney disease 09/11/2017  . Anemia 09/11/2017  . Prediabetes 09/11/2017   Guadelupe Sabin, OTR/L  [phone] 11/13/2017, 2:00 PM  [facility] [address] [place], [place], [address] Phone: [phone]   Fax:  [phone]  Name: Ilhan Debenedetto MRN: [id number] Date of Birth: 1955/08/23

## 2017-11-18 ENCOUNTER — Ambulatory Visit (HOSPITAL_COMMUNITY): Payer: Self-pay | Admitting: Occupational Therapy

## 2017-11-18 ENCOUNTER — Telehealth (HOSPITAL_COMMUNITY): Payer: Self-pay | Admitting: Occupational Therapy

## 2017-11-18 NOTE — Telephone Encounter (Signed)
Left message for pt regarding missed appt today at 11:15. Reminded of next appt and asked to call if unable to attend.    Ezra Sites, OTR/L  [phone] 11/18/2017

## 2017-11-20 ENCOUNTER — Encounter (HOSPITAL_COMMUNITY): Payer: Self-pay | Admitting: Occupational Therapy

## 2017-11-20 ENCOUNTER — Ambulatory Visit (HOSPITAL_COMMUNITY): Payer: Self-pay | Admitting: Occupational Therapy

## 2017-11-20 ENCOUNTER — Encounter (INDEPENDENT_AMBULATORY_CARE_PROVIDER_SITE_OTHER): Payer: Self-pay | Admitting: Orthopaedic Surgery

## 2017-11-20 ENCOUNTER — Ambulatory Visit (INDEPENDENT_AMBULATORY_CARE_PROVIDER_SITE_OTHER): Payer: Self-pay | Admitting: Orthopaedic Surgery

## 2017-11-20 VITALS — BP 111/77 | HR 76 | Ht 66.0 in | Wt 220.0 lb

## 2017-11-20 DIAGNOSIS — M25511 Pain in right shoulder: Secondary | ICD-10-CM

## 2017-11-20 DIAGNOSIS — Z981 Arthrodesis status: Secondary | ICD-10-CM

## 2017-11-20 DIAGNOSIS — M25611 Stiffness of right shoulder, not elsewhere classified: Secondary | ICD-10-CM

## 2017-11-20 DIAGNOSIS — R29898 Other symptoms and signs involving the musculoskeletal system: Secondary | ICD-10-CM

## 2017-11-20 NOTE — Progress Notes (Signed)
Office Visit Note/orthopedic rotation requested by [facility] Hawking  free clinic   Patient: Melissa Solis           Date of Birth: [birth year]           MRN: [id number] Visit Date: 11/20/2017              Requested by: Jacquelin Hawking, PA-C [address] [place], [place] [address] PCP: Jacquelin Hawking, PA-C   Assessment & Plan: Visit Diagnoses:  1. History of lumbar fusion     Plan: Patient needs to work on a walking program lose 20 to 30 pounds.  He is going to gym she is looking in water therapy.  Get her arm up overhead easily with her shoulder.  Discussed the rotator cuff tendinopathy that is present with the tear.  She will try to avoid a lot of overhead activity work on weight loss and office follow-up PRN.  Occasions were prescribed.  Follow-Up Instructions: No follow-ups on file.   Orders:  No orders of the defined types were placed in this encounter.  No orders of the defined types were placed in this encounter.     Procedures: No procedures performed   Clinical Data: No additional findings.   Subjective: Chief Complaint  Patient presents with  . Right Shoulder - Pain  . Lower Back - Pain    HPI the free clinic by nurse practitioner his right shoulder pain and also to level of a new.  Fusion 2013 L3-L5 rotator cuff repair 2019.  States she is moved to rocking him [place].  She can get her arm up overhead.  Patient had limited debridement of her SLAP tear and rotator cuff repair double row.  MRI report fine and also images to review today.  Patient states her back start taking after she walks a lot.  Review of Systems positive for hypertension chronic kidney disease anemia prediabetes    increased BMI 35.  Her cuff repair biceps tenotomy.  3 to L5 with mild narrowing at L2-3 above the fusion.   Objective: Vital Signs: BP 111/77   Pulse 76   Ht  (1.676 m)   Wt 220 lb (99.8 kg)   BMI 35.51 kg/m   Physical Exam  Constitutional: She is oriented to person,  place, and time. She appears well-developed.  HENT:  Head: Normocephalic.  Right Ear: External ear normal.  Left Ear: External ear normal.  Eyes: Pupils are equal, round, and reactive to light.  Neck: No tracheal deviation present. No thyromegaly present.  Cardiovascular: Normal rate.  Pulmonary/Chest: Effort normal.  Abdominal: Soft.  Neurological: She is alert and oriented to person, place, and time.  Skin: Skin is warm and dry.  Psychiatric: She has a normal mood and affect. Her behavior is normal.    Ortho Exam patient is able to get her arm up overhead well-healed arthroscopic portals from previous rotator cuff repair well-healed lumbar incision she can get from sitting to standing is to ambulate easily.  Pulses intact EHL anterior tib is strong.  Normal hip range of motion knees reach full extension.  No rash over exposed skin.  Good capillary refill.  Specialty Comments:  No specialty comments available.  Imaging: No results found.   PMFS History: Patient Active Problem List   Diagnosis Date Noted  . Essential hypertension 09/11/2017  . Chronic kidney disease 09/11/2017  . Anemia 09/11/2017  . Prediabetes 09/11/2017   Past Medical History:  Diagnosis Date  . Arthritis   .  Chronic back pain   . Chronic shoulder pain   . Depression   . GERD (gastroesophageal reflux disease)   . Hypertension     Family History  Problem Relation Age of Onset  . Hypertension Mother   . Diabetes Mother   . Hypertension Sister   . Stroke Sister   . Diabetes Brother   . Heart disease Brother   . Stroke Brother   . Hypertension Maternal Uncle   . Heart attack Maternal Uncle   . Stroke Maternal Uncle   . Hypertension Maternal Grandfather   . Heart disease Maternal Grandfather     Past Surgical History:  Procedure Laterality Date  . FOOT FRACTURE SURGERY Left   . REPLACEMENT TOTAL KNEE BILATERAL  2011  . ROTATOR CUFF REPAIR Right   . SPINAL FUSION     Social History    Occupational History  . Not on file  Tobacco Use  . Smoking status: Former Smoker    Packs/day: 0.25    Years: 20.00    Pack years: 5.00    Types: Cigarettes    Last attempt to quit: 09/20/2016    Years since quitting: 1.1  . Smokeless tobacco: Never Used  Substance and Sexual Activity  . Alcohol use: No    Frequency: Never    Comment: wine "every now and then"  . Drug use: No  . Sexual activity: Never

## 2017-11-20 NOTE — Therapy (Signed)
[place], [place], [address] Phone: [phone]   Fax:  [phone]  Occupational Therapy Treatment  Patient Details  Name: Melissa Solis MRN: [id number] Date of Birth: [birth year] Referring Provider: Soyla Dryer PA-C   Encounter Date: 11/20/2017  OT End of Session - 11/20/17 1206    Visit Number  16    Number of Visits  24    Date for OT Re-Evaluation  12/24/17    Authorization Type  [facility] [employer] approval. Effective 09/24/17-03/27/18    OT Start Time  1113    OT Stop Time  1159    OT Time Calculation (min)  46 min    Activity Tolerance  Patient tolerated treatment well    Behavior During Therapy  WFL for tasks assessed/performed       Past Medical History:  Diagnosis Date  . Arthritis   . Chronic back pain   . Chronic shoulder pain   . Depression   . GERD (gastroesophageal reflux disease)   . Hypertension     Past Surgical History:  Procedure Laterality Date  . FOOT FRACTURE SURGERY Left   . REPLACEMENT TOTAL KNEE BILATERAL  2011  . ROTATOR CUFF REPAIR Right   . SPINAL FUSION      There were no vitals filed for this visit.  Subjective Assessment - 11/20/17 1106    Subjective   S: I'm going to see the orthopedic doctor today.     Currently in Pain?  Yes    Pain Score  4     Pain Location  Shoulder    Pain Orientation  Right    Pain Descriptors / Indicators  Aching;Sore    Pain Type  Acute pain    Pain Radiating Towards  n/a    Pain Onset  More than a month ago    Pain Frequency  Intermittent    Aggravating Factors   movement, use    Pain Relieving Factors  ice, heat, biofreeze    Effect of Pain on Daily Activities  min effect    Multiple Pain Sites  No         [facility] OT Assessment - 11/20/17 1106      Assessment   Medical Diagnosis  right RTC repair      Precautions   Precautions  None    Precaution Comments  progress as tolerated      Palpation   Palpation comment  min  fascial restrictions along anterior deltoid regions      AROM   Overall AROM Comments  Assessed seated. IR/er adducted.    AROM Assessment Site  Shoulder    Right/Left Shoulder  Right    Right Shoulder Flexion  121 Degrees 107 previous    Right Shoulder ABduction  102 Degrees 75 previous    Right Shoulder Internal Rotation  90 Degrees same as previous    Right Shoulder External Rotation  76 Degrees 64 previous      PROM   Overall PROM Comments  Assessed supine. IR/er adducted.     PROM Assessment Site  Shoulder    Right/Left Shoulder  Right    Right Shoulder Flexion  180 Degrees 175 previous    Right Shoulder ABduction  180 Degrees same as previous    Right Shoulder Internal Rotation  90 Degrees same as previous    Right Shoulder External Rotation  90 Degrees 70      Strength   Overall Strength Comments  Assessed seated. IR/er adducted    Strength Assessment Site  Shoulder    Right/Left Shoulder  Right    Right Shoulder Flexion  3-/5 same as previous    Right Shoulder ABduction  3-/5 same as previous    Right Shoulder Internal Rotation  4/5 4-/5 previous    Right Shoulder External Rotation  4/5 4-/5 previous               OT Treatments/Exercises (OP) - 11/20/17 1106      Exercises   Exercises  Shoulder      Shoulder Exercises: Supine   Protraction  Strengthening;12 reps    Protraction Weight (lbs)  2    Horizontal ABduction  Strengthening;12 reps    Horizontal ABduction Weight (lbs)  2    External Rotation  Strengthening;12 reps    External Rotation Weight (lbs)  2    Internal Rotation  Strengthening;12 reps    Internal Rotation Weight (lbs)  2    Flexion  Strengthening;12 reps    Shoulder Flexion Weight (lbs)  2    ABduction  Strengthening;12 reps    Shoulder ABduction Weight (lbs)  2      Shoulder Exercises: Sidelying   External Rotation  AROM;12 reps    Internal Rotation  AROM;12 reps    Flexion  AROM;12 reps    ABduction  AROM;12 reps    Other  Sidelying Exercises  Protraction; 12X; A/ROM    Other Sidelying Exercises  Horizontal abduction; 12X; A/ROM      Shoulder Exercises: Standing   Protraction  Strengthening;10 reps    Protraction Weight (lbs)  1    Horizontal ABduction  AROM;10 reps    External Rotation  Strengthening;10 reps    External Rotation Weight (lbs)  1    Internal Rotation  Strengthening;10 reps    Internal Rotation Weight (lbs)  1    Flexion  Strengthening;10 reps    Shoulder Flexion Weight (lbs)  1    ABduction  AROM;10 reps      Shoulder Exercises: ROM/Strengthening   Over Head Lace  1'    X to V Arms  10X     Other ROM/Strengthening Exercises  lateral wall slides, 10X      Functional Reaching Activities   High Level  Pt placed and removed cones from middle and top shelves of cabinet in flexion and abduction with minimal difficulty      Manual Therapy   Manual Therapy  Myofascial release    Manual therapy comments  Manual therapy completed prior to exercises.     Myofascial Release  Myofascial release and manual stretching completed to right upper arm, trapezius, and scapularis region to decrease pain and restrictions that are prohibiting end range mobiilty.                 OT Short Term Goals - 11/20/17 1208      OT SHORT TERM GOAL #1   Title  Patient will be educated and independent with HEP to faciliate progress in therapy and allow her to return to using her RUE for all daily tasks.     Time  3    Period  Weeks    Status  On-going      OT SHORT TERM GOAL #2   Title  Patient will increase P/ROM to WNL to increase ability to get shirts on and off with less difficulty.     Time  3    Period  Weeks  OT SHORT TERM GOAL #3   Title  Patient will increase RUE strength to 4-/5 to increase ability to complete activities at shoulder level with less difficulty.     Time  3    Period  Weeks    Status  Partially Met      OT SHORT TERM GOAL #4   Title  Patient will decrease pain level in  RUE to approximately 3/10 or less when completing daily tasks.     Time  3    Period  Weeks    Status  On-going      OT SHORT TERM GOAL #5   Title  Patient will decrease fascial restrictions to min amount to increase functional mobility needed to fix and brush her hair using her RUE.     Time  3    Period  Weeks    Status  Achieved        OT Long Term Goals - 09/26/17 1103      OT LONG TERM GOAL #1   Title  Patient will return to highest level of independence with daily and leisure tasks while using her right UE as dominant extremity 100% of the time.    Time  6    Period  Weeks    Status  On-going      OT LONG TERM GOAL #2   Title  Patient will increase A/ROM to WNL to increase ability to reach overhead and out to the side with less difficulty.     Time  6    Period  Weeks    Status  On-going      OT LONG TERM GOAL #3   Title  Patient will increase RUE strength to 4+/5 to increase ability to complete heavy household chores and lifting tasks.     Time  6    Period  Weeks    Status  On-going      OT LONG TERM GOAL #4   Title  Patient will decrease RUE pain to 2/10 or less when completing daily tasks.     Time  6    Period  Weeks    Status  On-going            Plan - 11/20/17 1207    Clinical Impression Statement  A: Mini-reassessment completed this session, pt has made improvements in ROM and functional use of the RUE. Primary deficits are strength and pain. Pt has met 2/5 STGs and partially met an additional STG. Continued with manual therapy today working to decrease fascial restriction palpated at anterior deltoid region. Session focusing on RUE strengthening and functional reaching, resumed sidelying exercises during which pt able to achieve ROM WNL. Verbal cuing during session for form and technique.     Plan  P: Follow up on MD appt, myofascial release prn with no passive stretching. Work on improving form during exercises       Patient will benefit from  skilled therapeutic intervention in order to improve the following deficits and impairments:  Decreased activity tolerance, Decreased strength, Decreased range of motion, Pain, Impaired UE functional use, Increased fascial restrictions  Visit Diagnosis: Other symptoms and signs involving the musculoskeletal system  Acute pain of right shoulder  Stiffness of right shoulder, not elsewhere classified    Problem List Patient Active Problem List   Diagnosis Date Noted  . Essential hypertension 09/11/2017  . Chronic kidney disease 09/11/2017  . Anemia 09/11/2017  . Prediabetes 09/11/2017   Guadelupe Sabin,  OTR/L  [phone] 11/20/2017, 12:11 PM  [place], [place], [address] Phone: [phone]   Fax:  [phone]  Name: Melissa Solis MRN: [id number] Date of Birth: 11-13-1955

## 2017-11-25 ENCOUNTER — Encounter (HOSPITAL_COMMUNITY): Payer: Self-pay | Admitting: Occupational Therapy

## 2017-11-25 ENCOUNTER — Ambulatory Visit (HOSPITAL_COMMUNITY): Payer: Self-pay | Admitting: Occupational Therapy

## 2017-11-25 DIAGNOSIS — M25511 Pain in right shoulder: Secondary | ICD-10-CM

## 2017-11-25 DIAGNOSIS — R29898 Other symptoms and signs involving the musculoskeletal system: Secondary | ICD-10-CM

## 2017-11-25 DIAGNOSIS — M25611 Stiffness of right shoulder, not elsewhere classified: Secondary | ICD-10-CM

## 2017-11-25 NOTE — Therapy (Signed)
[place], [place], [address] Phone: [phone]   Fax:  [phone]  Occupational Therapy Treatment  Patient Details  Name: Melissa Solis MRN: [id number] Date of Birth: Jun 04, 1956 Referring Provider: Soyla Dryer PA-C   Encounter Date: 11/25/2017  OT End of Session - 11/25/17 1201    Visit Number  17    Number of Visits  24    Date for OT Re-Evaluation  12/24/17    Authorization Type  [facility] [employer] approval. Effective 09/24/17-03/27/18    OT Start Time  1132 pt arrived at 1119 but was not checked in     OT Stop Time  1158    OT Time Calculation (min)  26 min    Activity Tolerance  Patient tolerated treatment well    Behavior During Therapy  [facility] for tasks assessed/performed       Past Medical History:  Diagnosis Date  . Arthritis   . Chronic back pain   . Chronic shoulder pain   . Depression   . GERD (gastroesophageal reflux disease)   . Hypertension     Past Surgical History:  Procedure Laterality Date  . FOOT FRACTURE SURGERY Left   . REPLACEMENT TOTAL KNEE BILATERAL  2011  . ROTATOR CUFF REPAIR Right   . SPINAL FUSION      There were no vitals filed for this visit.  Subjective Assessment - 11/25/17 1134    Subjective   S: The doctor didn't tell me anything new.     Currently in Pain?  Yes    Pain Score  4     Pain Location  Shoulder    Pain Orientation  Right    Pain Descriptors / Indicators  Aching;Sore    Pain Type  Acute pain    Pain Radiating Towards  n/a    Pain Onset  More than a month ago    Pain Frequency  Intermittent    Aggravating Factors   movement, lifting    Pain Relieving Factors  biofreeze, heat, ice    Effect of Pain on Daily Activities  min effect on ADLs    Multiple Pain Sites  No         [facility] OT Assessment - 11/25/17 1134      Assessment   Medical Diagnosis  right RTC repair      Precautions   Precautions  None    Precaution Comments  progress as  tolerated               OT Treatments/Exercises (OP) - 11/25/17 1136      Exercises   Exercises  Shoulder      Shoulder Exercises: Supine   Protraction  Strengthening;12 reps    Protraction Weight (lbs)  2    Horizontal ABduction  Strengthening;12 reps    Horizontal ABduction Weight (lbs)  2    External Rotation  Strengthening;12 reps    External Rotation Weight (lbs)  2    Internal Rotation  Strengthening;12 reps    Internal Rotation Weight (lbs)  2    Flexion  Strengthening;12 reps    Shoulder Flexion Weight (lbs)  2    ABduction  Strengthening;12 reps    Shoulder ABduction Weight (lbs)  2      Shoulder Exercises: Sidelying   External Rotation  Strengthening;10 reps    External Rotation Weight (lbs)  1    Internal Rotation  Strengthening;10 reps    Internal Rotation Weight (lbs)  1    Flexion  Strengthening;10 reps    Flexion Weight (lbs)  1    ABduction  Strengthening;10 reps    ABduction Weight (lbs)  1    Other Sidelying Exercises  Protraction; 1#, 10x    Other Sidelying Exercises  Horizontal abduction; 1#, 10X      Shoulder Exercises: Standing   Extension  Theraband;15 reps    Theraband Level (Shoulder Extension)  Level 2 (Red)    Row  Theraband;15 reps    Theraband Level (Shoulder Row)  Level 2 (Red)    Retraction  Theraband;15 reps    Theraband Level (Shoulder Retraction)  Level 2 (Red)      Shoulder Exercises: Therapy Ball   Other Therapy Ball Exercises  green ball: chest press, overhead press, flexion, 10X each      Shoulder Exercises: ROM/Strengthening   X to V Arms  10X, 1#    Proximal Shoulder Strengthening, Supine  12X with 2# no rest breaks    Ball on Wall  1' flexion 1' abduction green ball               OT Short Term Goals - 11/20/17 1208      OT SHORT TERM GOAL #1   Title  Patient will be educated and independent with HEP to faciliate progress in therapy and allow her to return to using her RUE for all daily tasks.     Time  3     Period  Weeks    Status  On-going      OT SHORT TERM GOAL #2   Title  Patient will increase P/ROM to WNL to increase ability to get shirts on and off with less difficulty.     Time  3    Period  Weeks      OT SHORT TERM GOAL #3   Title  Patient will increase RUE strength to 4-/5 to increase ability to complete activities at shoulder level with less difficulty.     Time  3    Period  Weeks    Status  Partially Met      OT SHORT TERM GOAL #4   Title  Patient will decrease pain level in RUE to approximately 3/10 or less when completing daily tasks.     Time  3    Period  Weeks    Status  On-going      OT SHORT TERM GOAL #5   Title  Patient will decrease fascial restrictions to min amount to increase functional mobility needed to fix and brush her hair using her RUE.     Time  3    Period  Weeks    Status  Achieved        OT Long Term Goals - 09/26/17 1103      OT LONG TERM GOAL #1   Title  Patient will return to highest level of independence with daily and leisure tasks while using her right UE as dominant extremity 100% of the time.    Time  6    Period  Weeks    Status  On-going      OT LONG TERM GOAL #2   Title  Patient will increase A/ROM to WNL to increase ability to reach overhead and out to the side with less difficulty.     Time  6    Period  Weeks    Status  On-going      OT LONG TERM GOAL #3  Title  Patient will increase RUE strength to 4+/5 to increase ability to complete heavy household chores and lifting tasks.     Time  6    Period  Weeks    Status  On-going      OT LONG TERM GOAL #4   Title  Patient will decrease RUE pain to 2/10 or less when completing daily tasks.     Time  6    Period  Weeks    Status  On-going            Plan - 11/25/17 1201    Clinical Impression Statement  A: No manual therapy completed due to session beginning late, pt unable to stay past 12:00 due to another committment. Session focusing on RUE strengthening,  added 1# weight in sidelying. Resumed scapular strengthening. Verbal cuing for form and technique during session. Pt reports MD appt did not go well, MD did not look at arm or order any imaging.     Plan  P: continue with shoulder strengthening adding sidelying exercises to HEP. Continue working on form        Patient will benefit from skilled therapeutic intervention in order to improve the following deficits and impairments:  Decreased activity tolerance, Decreased strength, Decreased range of motion, Pain, Impaired UE functional use, Increased fascial restrictions  Visit Diagnosis: Other symptoms and signs involving the musculoskeletal system  Acute pain of right shoulder  Stiffness of right shoulder, not elsewhere classified    Problem List Patient Active Problem List   Diagnosis Date Noted  . Essential hypertension 09/11/2017  . Chronic kidney disease 09/11/2017  . Anemia 09/11/2017  . Prediabetes 09/11/2017   Guadelupe Sabin, OTR/L  [phone] 11/25/2017, 12:03 PM  [place], [place], [address] Phone: [phone]   Fax:  [phone]  Name: Melissa Solis MRN: [id number] Date of Birth: 09/20/1955

## 2017-11-27 ENCOUNTER — Ambulatory Visit
Admission: RE | Admit: 2017-11-27 | Discharge: 2017-11-27 | Disposition: A | Discharge status: Home / Self Care | Payer: No Typology Code available for payment source | Source: Ambulatory Visit | Attending: Obstetrics and Gynecology | Admitting: Obstetrics and Gynecology

## 2017-11-27 ENCOUNTER — Ambulatory Visit (HOSPITAL_COMMUNITY)
Admission: RE | Admit: 2017-11-27 | Discharge: 2017-11-27 | Disposition: A | Discharge status: Home / Self Care | Payer: Self-pay | Source: Ambulatory Visit | Attending: Obstetrics and Gynecology | Admitting: Obstetrics and Gynecology

## 2017-11-27 ENCOUNTER — Encounter (HOSPITAL_COMMUNITY): Payer: No Typology Code available for payment source | Admitting: Occupational Therapy

## 2017-11-27 ENCOUNTER — Encounter (HOSPITAL_COMMUNITY): Payer: Self-pay

## 2017-11-27 VITALS — BP 122/86 | Ht 66.0 in

## 2017-11-27 DIAGNOSIS — N644 Mastodynia: Secondary | ICD-10-CM

## 2017-11-27 DIAGNOSIS — R928 Other abnormal and inconclusive findings on diagnostic imaging of breast: Secondary | ICD-10-CM

## 2017-11-27 DIAGNOSIS — Z1239 Encounter for other screening for malignant neoplasm of breast: Secondary | ICD-10-CM

## 2017-11-27 NOTE — Patient Instructions (Signed)
Explained breast self awareness with Meleena Breshears. Patient did not need a Pap smear today due to last Pap smear was in September 2018 per patient. Let her know [facility] will cover Pap smears every 3 years unless has a history of abnormal Pap smears. Referred patient to the [facility] for a right breast diagnostic mammogram and ultrasound per recommendation. Appointment scheduled for Thursday, Nov 27, 2017 at 1450. Tannya Colcord verbalized understanding.  [place], Kathaleen Maser, RN 4:11 PM

## 2017-11-27 NOTE — Progress Notes (Signed)
Patient referred to [facility] by the [facility] due to recommending additional imaging of the right breast. Screening mammogram completed 10/24/2017.  Patient complained of a right axillary lump x 2 months that increases in size at times.  Pap Smear: Pap smear not completed today. Last Pap smear was in September 2018 in [place] and normal per patient. Per patient has no history of an abnormal Pap smear. No Pap smear results are in Epic.  Physical exam: Breasts Left breast is slightly larger than right breast that per patient has noticed the change over the past year. No skin abnormalities bilateral breasts. No nipple retraction bilateral breasts. No nipple discharge bilateral breasts. No lymphadenopathy. No lumps palpated bilateral breasts. Unable to palpate a lump in patients area of concern. Complaints of right breast pain at 11 o'clock 11 cm from the nipple on exam. Referred patient to the [facility] for a right breast diagnostic mammogram and ultrasound per recommendation. Appointment scheduled for Thursday, Nov 27, 2017 at 1450.        Pelvic/Bimanual No Pap smear completed today since last Pap smear was in September 2018 per patient. Pap smear not indicated per [facility]CP guidelines.   Smoking History: Patient is a former smoker that quit 09/20/2016.  Patient Navigation: Patient education provided. Access to services provided for patient through [facility] program.  Colorectal Cancer Screening: Per patient has never had a colonoscopy completed. Patient stated she completed a FIT test in April 2019 that was negative. No complaints today.   Breast and Cervical Cancer Risk Assessment: Patient has no family history of breast cancer, known genetic mutations, or radiation treatment to the chest before age 74. Patient has no history of cervical dysplasia, immunocompromised, or DES exposure in-utero. Patient has a 5-year risk for breast cancer at 1.2% and a lifetime risk  at 5%.

## 2017-11-28 ENCOUNTER — Other Ambulatory Visit: Payer: Self-pay

## 2017-11-28 ENCOUNTER — Encounter (HOSPITAL_COMMUNITY): Payer: Self-pay | Admitting: Occupational Therapy

## 2017-11-28 ENCOUNTER — Emergency Department (HOSPITAL_COMMUNITY)
Admission: EM | Admit: 2017-11-28 | Discharge: 2017-11-28 | Disposition: A | Discharge status: Home / Self Care | Payer: No Typology Code available for payment source | Attending: Emergency Medicine | Admitting: Emergency Medicine

## 2017-11-28 ENCOUNTER — Encounter (HOSPITAL_COMMUNITY): Payer: Self-pay

## 2017-11-28 ENCOUNTER — Ambulatory Visit (HOSPITAL_COMMUNITY): Payer: No Typology Code available for payment source | Admitting: Occupational Therapy

## 2017-11-28 DIAGNOSIS — M545 Low back pain: Secondary | ICD-10-CM | POA: Insufficient documentation

## 2017-11-28 DIAGNOSIS — M25511 Pain in right shoulder: Secondary | ICD-10-CM | POA: Insufficient documentation

## 2017-11-28 DIAGNOSIS — M25611 Stiffness of right shoulder, not elsewhere classified: Secondary | ICD-10-CM

## 2017-11-28 DIAGNOSIS — G8929 Other chronic pain: Secondary | ICD-10-CM

## 2017-11-28 DIAGNOSIS — I10 Essential (primary) hypertension: Secondary | ICD-10-CM | POA: Insufficient documentation

## 2017-11-28 DIAGNOSIS — Z79899 Other long term (current) drug therapy: Secondary | ICD-10-CM | POA: Insufficient documentation

## 2017-11-28 DIAGNOSIS — Z87891 Personal history of nicotine dependence: Secondary | ICD-10-CM | POA: Insufficient documentation

## 2017-11-28 DIAGNOSIS — R29898 Other symptoms and signs involving the musculoskeletal system: Secondary | ICD-10-CM

## 2017-11-28 MED ORDER — BACLOFEN 20 MG PO TABS: 20.0000 mg | ORAL_TABLET | Freq: Three times a day (TID) | ORAL | 0 refills | Status: DC

## 2017-11-28 MED ORDER — MELOXICAM 15 MG PO TABS: 15.0000 mg | ORAL_TABLET | Freq: Every day | ORAL | 0 refills | Status: DC

## 2017-11-28 NOTE — Discharge Instructions (Signed)
Your vital signs have been reviewed.  No acute changes on your examination noted at this time.  I suspect that your back pain is an exacerbation of the chronic pain following surgery and arthritis of your back.  I suspect that the shoulder pain is continuation of the healing process and recovery process from your rotator cuff your surgery and degenerative changes probably in your shoulder as well.  Please see Dr. Romeo AppleHarrison for additional evaluation if you do not go back to follow-up with Dr. Kevan NyGates.  Please use baclofen and meloxicam for your discomfort.

## 2017-11-28 NOTE — ED Triage Notes (Signed)
Pt is complaining of lower back pain. Pt has spinal fusion surgery 7 years ago. Has arthritis in her back. Pt went to orthopedic doctor for same last week. Also complaining of shoulder pain. Had a torn rotator cuff surgery in January. Saw orthopedic doctor to follow up last week as well.

## 2017-11-28 NOTE — ED Provider Notes (Signed)
[facility] EMERGENCY DEPARTMENT Provider Note   CSN: [id number] Arrival date & time: 11/28/17  1244     History   Chief Complaint Chief Complaint  Patient presents with  . Back Pain  . Shoulder Pain    HPI Melissa Solis is a 62 y.o. female.  Patient is a 62 year old female who presents to the emergency department with back pain and shoulder pain.  The patient states that she had spinal fusion surgery about 7 years ago in [place]..  She had shoulder surgery in January 2017.  The patient states she is been having pain from both areas that has become more intense recently.  No recent injury to either the shoulder or to the back.  The patient states she was seen by an orthopedic specialist approximately a week ago and she says that she was very unsatisfied with the visit, and that they did not do anything to help her with her pain or her discomfort.  She presents to the emergency department for additional evaluation and also for some assistance with her discomfort.     Past Medical History:  Diagnosis Date  . Arthritis   . Chronic back pain   . Chronic shoulder pain   . Depression   . GERD (gastroesophageal reflux disease)   . Hypertension     Patient Active Problem List   Diagnosis Date Noted  . Essential hypertension 09/11/2017  . Chronic kidney disease 09/11/2017  . Anemia 09/11/2017  . Prediabetes 09/11/2017    Past Surgical History:  Procedure Laterality Date  . FOOT FRACTURE SURGERY Left   . REPLACEMENT TOTAL KNEE BILATERAL  2011  . ROTATOR CUFF REPAIR Right   . SPINAL FUSION       OB History    Gravida  2   Para      Term      Preterm      AB  1   Living  1     SAB      TAB  1   Ectopic      Multiple      Live Births  1            Home Medications    Prior to Admission medications   Medication Sig Start Date End Date Taking? Authorizing Provider  baclofen (LIORESAL) 10 MG tablet TK 1 T PO BID PRF SPASMS OR PAIN 11/12/17    [provider]  DOCUSATE SODIUM PO Take 1 tablet by mouth as needed.    [provider]  hydrochlorothiazide (MICROZIDE) 12.5 MG capsule Take 1 capsule (12.5 mg total) by mouth daily. 10/08/17   Jacquelin Hawking, PA-C  Iron Combinations (IRON COMPLEX) CAPS daily 09/11/17   Jacquelin Hawking, PA-C  metoprolol tartrate (LOPRESSOR) 100 MG tablet Take 1 tablet (100 mg total) by mouth 2 (two) times daily. 09/11/17   Jacquelin Hawking, PA-C  nabumetone (RELAFEN) 500 MG tablet Take 500 mg by mouth 2 (two) times daily as needed for moderate pain.     [provider]  omeprazole (PRILOSEC) 40 MG capsule Take 1 capsule (40 mg total) by mouth daily. 09/11/17   Jacquelin Hawking, PA-C    Family History Family History  Problem Relation Age of Onset  . Hypertension Mother   . Diabetes Mother   . Hypertension Sister   . Stroke Sister   . Diabetes Brother   . Heart disease Brother   . Stroke Brother   . Hypertension Maternal Uncle   . Heart attack  Maternal Uncle   . Stroke Maternal Uncle   . Hypertension Maternal Grandfather   . Heart disease Maternal Grandfather     Social History Social History   Tobacco Use  . Smoking status: Former Smoker    Packs/day: 0.25    Years: 20.00    Pack years: 5.00    Types: Cigarettes    Last attempt to quit: 09/20/2016    Years since quitting: 1.1  . Smokeless tobacco: Never Used  Substance Use Topics  . Alcohol use: No    Frequency: Never    Comment: wine "every now and then"  . Drug use: No     Allergies   Ace inhibitors   Review of Systems Review of Systems  Constitutional: Negative for activity change.       All ROS Neg except as noted in HPI  HENT: Negative for nosebleeds.   Eyes: Negative for photophobia and discharge.  Respiratory: Negative for cough, shortness of breath and wheezing.   Cardiovascular: Negative for chest pain and palpitations.  Gastrointestinal: Negative for abdominal pain and blood in stool.    Genitourinary: Negative for dysuria, frequency and hematuria.  Musculoskeletal: Positive for arthralgias and back pain. Negative for neck pain.  Skin: Negative.   Neurological: Negative for dizziness, seizures and speech difficulty.  Psychiatric/Behavioral: Negative for confusion and hallucinations.     Physical Exam Updated Vital Signs BP (!) 146/98 (BP Location: Left Arm)   Pulse 74   Temp 98.5 F (36.9 C) (Oral)   Resp 14   SpO2 99%   Physical Exam  Constitutional: She is oriented to person, place, and time. She appears well-developed and well-nourished.  Non-toxic appearance.  HENT:  Head: Normocephalic.  Right Ear: Tympanic membrane and external ear normal.  Left Ear: Tympanic membrane and external ear normal.  Eyes: Pupils are equal, round, and reactive to light. EOM and lids are normal.  Neck: Normal range of motion. Neck supple. Carotid bruit is not present.  Cardiovascular: Normal rate, regular rhythm, normal heart sounds, intact distal pulses and normal pulses.  Pulmonary/Chest: Breath sounds normal. No respiratory distress.  Abdominal: Soft. Bowel sounds are normal. There is no tenderness. There is no guarding.  Musculoskeletal: Normal range of motion.  Palpation of the anterior shoulder.  There are no hot joints appreciated.  There is no deformity appreciated.  The brachial and radial pulses are 2+.  Capillary refill is less than 2 seconds.  There is pain of the right paraspinal region of the lumbar spine.  There is no palpable step-off of the lumbar spine.  Lymphadenopathy:       Head (right side): No submandibular adenopathy present.       Head (left side): No submandibular adenopathy present.    She has no cervical adenopathy.  Neurological: She is alert and oriented to person, place, and time. She has normal strength. No cranial nerve deficit or sensory deficit.  Grip is symmetrical.  There is no sensory deficits of the upper extremity.  Gait is intact.  There  is no foot drop.  There are no sensory deficits of the lower extremities.  Skin: Skin is warm and dry.  Psychiatric: She has a normal mood and affect. Her speech is normal.  Nursing note and vitals reviewed.    ED Treatments / Results  Labs (all labs ordered are listed, but only abnormal results are displayed) Labs Reviewed - No data to display  EKG None  Radiology US Breast Ltd Uni Right Inc Axilla  Result Date: 11/27/2017 CLINICAL DATA:  62 year old female recalled from screening mammogram dated 10/24/2017 for a possible right breast mass. The patient also states she has a right axillary palpable abnormality that comes and goes for the last several months. EXAM: DIGITAL DIAGNOSTIC RIGHT MAMMOGRAM WITH CAD AND TOMO ULTRASOUND RIGHT BREAST COMPARISON:  Previous exam(s). ACR Breast Density Category a: The breast tissue is almost entirely fatty. FINDINGS: An oval, circumscribed isoechoic masses demonstrated in the upper outer quadrant of the right breast at middle depth. A radiopaque BB is placed at the site of the patient's palpable abnormality with no additional suspicious findings identified. Mammographic images were processed with CAD. Targeted ultrasound is performed, showing an oval, circumscribed hypoechoic mass at the 9 o'clock position 8 cm from the nipple. It measures 1.1 x 1.0 x 0.4 cm. Note is made of internal blood flow. This correlates well with the mammographic finding. Evaluation at the axillary tail demonstrates no focal findings at the site of the patient's palpable abnormality. Additional evaluation of the right axilla demonstrates no suspicious lymphadenopathy. IMPRESSION: 1. Indeterminate right breast mass corresponding with the screening mammographic findings. The findings may represent a fibroadenoma. The patient's prior mammogram images have been requested and sent, but have not arrived yet. Recommendation is for comparison to prior mammogram to evaluate stability of these  findings. If this area is not seen on prior imaging, ultrasound-guided biopsy is recommended. 2. No mammographic or sonographic findings to explain the patient's right axillary tail palpable abnormality. Recommendation is for clinical and symptomatic follow-up. 3. No suspicious right axillary lymphadenopathy. RECOMMENDATION: 1. Recommendation is to wait for the patient's prior mammograms to arrive for comparison. If the area of concern is not definitively seen on prior images, recommendation is for ultrasound-guided biopsy. 2. Clinical and symptomatic follow-up is recommended for the patient's right axillary tail palpable abnormality. I have discussed the findings and recommendations with the patient. Results were also provided in writing at the conclusion of the visit. If applicable, a reminder letter will be sent to the patient regarding the next appointment. BI-RADS CATEGORY  0: Incomplete. Need additional imaging evaluation and/or prior mammograms for comparison. Electronically Signed   By: Sande Brothers M.D.   On: 11/27/2017 16:11   Mm Diag Breast Tomo Uni Right  Result Date: 11/27/2017 CLINICAL DATA:  62 year old female recalled from screening mammogram dated 10/24/2017 for a possible right breast mass. The patient also states she has a right axillary palpable abnormality that comes and goes for the last several months. EXAM: DIGITAL DIAGNOSTIC RIGHT MAMMOGRAM WITH CAD AND TOMO ULTRASOUND RIGHT BREAST COMPARISON:  Previous exam(s). ACR Breast Density Category a: The breast tissue is almost entirely fatty. FINDINGS: An oval, circumscribed isoechoic masses demonstrated in the upper outer quadrant of the right breast at middle depth. A radiopaque BB is placed at the site of the patient's palpable abnormality with no additional suspicious findings identified. Mammographic images were processed with CAD. Targeted ultrasound is performed, showing an oval, circumscribed hypoechoic mass at the 9 o'clock position  8 cm from the nipple. It measures 1.1 x 1.0 x 0.4 cm. Note is made of internal blood flow. This correlates well with the mammographic finding. Evaluation at the axillary tail demonstrates no focal findings at the site of the patient's palpable abnormality. Additional evaluation of the right axilla demonstrates no suspicious lymphadenopathy. IMPRESSION: 1. Indeterminate right breast mass corresponding with the screening mammographic findings. The findings may represent a fibroadenoma. The patient's prior mammogram images have been requested and sent,  but have not arrived yet. Recommendation is for comparison to prior mammogram to evaluate stability of these findings. If this area is not seen on prior imaging, ultrasound-guided biopsy is recommended. 2. No mammographic or sonographic findings to explain the patient's right axillary tail palpable abnormality. Recommendation is for clinical and symptomatic follow-up. 3. No suspicious right axillary lymphadenopathy. RECOMMENDATION: 1. Recommendation is to wait for the patient's prior mammograms to arrive for comparison. If the area of concern is not definitively seen on prior images, recommendation is for ultrasound-guided biopsy. 2. Clinical and symptomatic follow-up is recommended for the patient's right axillary tail palpable abnormality. I have discussed the findings and recommendations with the patient. Results were also provided in writing at the conclusion of the visit. If applicable, a reminder letter will be sent to the patient regarding the next appointment. BI-RADS CATEGORY  0: Incomplete. Need additional imaging evaluation and/or prior mammograms for comparison. Electronically Signed   By: Sande Brothers M.D.   On: 11/27/2017 16:11    Procedures Procedures (including critical care time)  Medications Ordered in ED Medications - No data to display   Initial Impression / Assessment and Plan / ED Course  I have reviewed the triage vital signs and the  nursing notes.  Pertinent labs & imaging results that were available during my care of the patient were reviewed by me and considered in my medical decision making (see chart for details).      Final Clinical Impressions(s) / ED Diagnoses MDM  Vital signs reviewed.  Pulse oximetry is 99% on room air.  Within normal limits by my interpretation.  No hot joints appreciated.  No recent trauma to suspect fracture or dislocation.  As expect the patient's shoulder pain is continued pain related to the rotator cuff repair and possibly degenerative changes.  I suspect that the back pain is muscle related and also aggravation of arthritis in the lumbar spine.  The patient will be treated with meloxicam and Flexeril.  The patient will be given the name of the orthopedic specialist on call for [place] today, as she wants to see someone differently.   Final diagnoses:  Chronic right-sided low back pain without sciatica  Acute pain of right shoulder    ED Discharge Orders        Ordered    baclofen (LIORESAL) 20 MG tablet  3 times daily     11/28/17 1508    meloxicam (MOBIC) 15 MG tablet  Daily     11/28/17 1508       Ivery Quale, PA-C 11/28/17 1509    Bethann Berkshire, MD 11/28/17 1513

## 2017-11-28 NOTE — Therapy (Signed)
[place], [place], [address] Phone: [phone]   Fax:  [phone]  Occupational Therapy Treatment  Patient Details  Name: Melissa Solis MRN: [id number] Date of Birth: 1956-02-23 Referring Provider: Soyla Dryer PA-C   Encounter Date: 11/28/2017  OT End of Session - 11/28/17 1618    Visit Number  18    Number of Visits  24    Date for OT Re-Evaluation  12/24/17    Authorization Type  [facility] Charity approval. Effective 09/24/17-03/27/18    OT Start Time  1522    OT Stop Time  1602    OT Time Calculation (min)  40 min    Activity Tolerance  Patient tolerated treatment well    Behavior During Therapy  WFL for tasks assessed/performed       Past Medical History:  Diagnosis Date  . Arthritis   . Chronic back pain   . Chronic shoulder pain   . Depression   . GERD (gastroesophageal reflux disease)   . Hypertension     Past Surgical History:  Procedure Laterality Date  . FOOT FRACTURE SURGERY Left   . REPLACEMENT TOTAL KNEE BILATERAL  2011  . ROTATOR CUFF REPAIR Right   . SPINAL FUSION      There were no vitals filed for this visit.  Subjective Assessment - 11/28/17 1520    Subjective   S: My shoulder is really bothering me.     Currently in Pain?  Yes    Pain Score  4     Pain Location  Shoulder    Pain Orientation  Right    Pain Descriptors / Indicators  Aching;Sore    Pain Type  Acute pain    Pain Radiating Towards  goes to wrist    Pain Onset  More than a month ago    Pain Frequency  Intermittent    Aggravating Factors   movement, lifting    Pain Relieving Factors  biofreeze, heat, ice    Effect of Pain on Daily Activities  min effect on ADLs    Multiple Pain Sites  No         [facility] OT Assessment - 11/28/17 1520      Assessment   Medical Diagnosis  right RTC repair      Precautions   Precautions  None    Precaution Comments  progress as tolerated               OT  Treatments/Exercises (OP) - 11/28/17 1524      Exercises   Exercises  Shoulder      Shoulder Exercises: Supine   Protraction  Strengthening;12 reps    Protraction Weight (lbs)  2    Horizontal ABduction  Strengthening;12 reps    Horizontal ABduction Weight (lbs)  2    External Rotation  Strengthening;12 reps    External Rotation Weight (lbs)  2    Internal Rotation  Strengthening;12 reps    Internal Rotation Weight (lbs)  2    Flexion  Strengthening;12 reps    Shoulder Flexion Weight (lbs)  2    ABduction  Strengthening;12 reps    Shoulder ABduction Weight (lbs)  2      Shoulder Exercises: Standing   Protraction  Strengthening;10 reps    Protraction Weight (lbs)  1    Horizontal ABduction  AROM;10 reps    External Rotation  Strengthening;10 reps    External Rotation Weight (lbs)  1  Internal Rotation  Strengthening;10 reps    Internal Rotation Weight (lbs)  1    Flexion  AROM;10 reps    ABduction  AROM;10 reps    Extension  Theraband;15 reps    Theraband Level (Shoulder Extension)  Level 2 (Red)    Row  Theraband;15 reps    Theraband Level (Shoulder Row)  Level 2 (Red)    Retraction  Theraband;15 reps    Theraband Level (Shoulder Retraction)  Level 2 (Red)      Shoulder Exercises: ROM/Strengthening   UBE (Upper Arm Bike)  Level1 4' reverse    Over Head Lace  1', 1#    X to V Arms  10X    Proximal Shoulder Strengthening, Supine  12X with 2# no rest breaks    Other ROM/Strengthening Exercises  horizontal abduction with serratus anterior, lateral wall slides, 10X red band      Manual Therapy   Manual Therapy  Myofascial release    Manual therapy comments  Manual therapy completed prior to exercises.     Myofascial Release  Myofascial release and manual stretching completed to right upper arm, trapezius, and scapularis region to decrease pain and restrictions that are prohibiting end range mobiilty.                 OT Short Term Goals - 11/20/17 1208      OT  SHORT TERM GOAL #1   Title  Patient will be educated and independent with HEP to faciliate progress in therapy and allow her to return to using her RUE for all daily tasks.     Time  3    Period  Weeks    Status  On-going      OT SHORT TERM GOAL #2   Title  Patient will increase P/ROM to WNL to increase ability to get shirts on and off with less difficulty.     Time  3    Period  Weeks      OT SHORT TERM GOAL #3   Title  Patient will increase RUE strength to 4-/5 to increase ability to complete activities at shoulder level with less difficulty.     Time  3    Period  Weeks    Status  Partially Met      OT SHORT TERM GOAL #4   Title  Patient will decrease pain level in RUE to approximately 3/10 or less when completing daily tasks.     Time  3    Period  Weeks    Status  On-going      OT SHORT TERM GOAL #5   Title  Patient will decrease fascial restrictions to min amount to increase functional mobility needed to fix and brush her hair using her RUE.     Time  3    Period  Weeks    Status  Achieved        OT Long Term Goals - 09/26/17 1103      OT LONG TERM GOAL #1   Title  Patient will return to highest level of independence with daily and leisure tasks while using her right UE as dominant extremity 100% of the time.    Time  6    Period  Weeks    Status  On-going      OT LONG TERM GOAL #2   Title  Patient will increase A/ROM to WNL to increase ability to reach overhead and out to the side with less difficulty.  Time  6    Period  Weeks    Status  On-going      OT LONG TERM GOAL #3   Title  Patient will increase RUE strength to 4+/5 to increase ability to complete heavy household chores and lifting tasks.     Time  6    Period  Weeks    Status  On-going      OT LONG TERM GOAL #4   Title  Patient will decrease RUE pain to 2/10 or less when completing daily tasks.     Time  6    Period  Weeks    Status  On-going            Plan - 11/28/17 1618     Clinical Impression Statement  A: Pt reports soreness in shoulder today. Moderate fascial restrictions palpated in bicep, anterior deltoid, and scapularis regions. Manual therapy completed to address fascial restrictions. Continued with strengthening, pt unable to complete with weight in sitting due to pain. Completed loop band and scapular theraband for scapular stability. Verbal cuing for form and technique throughout session.     Plan  P: continue with strengthening working on form. Attempt weights in sitting, resume sidelying and add to HEP       Patient will benefit from skilled therapeutic intervention in order to improve the following deficits and impairments:  Decreased activity tolerance, Decreased strength, Decreased range of motion, Pain, Impaired UE functional use, Increased fascial restrictions  Visit Diagnosis: Other symptoms and signs involving the musculoskeletal system  Acute pain of right shoulder  Stiffness of right shoulder, not elsewhere classified    Problem List Patient Active Problem List   Diagnosis Date Noted  . Essential hypertension 09/11/2017  . Chronic kidney disease 09/11/2017  . Anemia 09/11/2017  . Prediabetes 09/11/2017   Guadelupe Sabin, OTR/L  [phone] 11/28/2017, 4:22 PM  [place] [address] [place], [place], [address] Phone: [phone]   Fax:  [phone]  Name: Melissa Solis MRN: [id number] Date of Birth: 05-Apr-1956

## 2017-11-29 LAB — GLUCOSE, POCT (MANUAL RESULT ENTRY): POC Glucose: 129 mg/dl — AB (ref 70–99)

## 2017-12-02 ENCOUNTER — Ambulatory Visit (HOSPITAL_COMMUNITY): Payer: No Typology Code available for payment source | Attending: Physician Assistant | Admitting: Occupational Therapy

## 2017-12-02 ENCOUNTER — Encounter (HOSPITAL_COMMUNITY): Payer: Self-pay | Admitting: Occupational Therapy

## 2017-12-02 DIAGNOSIS — R29898 Other symptoms and signs involving the musculoskeletal system: Secondary | ICD-10-CM | POA: Insufficient documentation

## 2017-12-02 DIAGNOSIS — M25611 Stiffness of right shoulder, not elsewhere classified: Secondary | ICD-10-CM

## 2017-12-02 DIAGNOSIS — M25511 Pain in right shoulder: Secondary | ICD-10-CM | POA: Insufficient documentation

## 2017-12-02 NOTE — Therapy (Signed)
[facility] [place], [place], [address] Phone: [phone]   Fax:  [phone]  Occupational Therapy Treatment  Patient Details  Name: Melissa Solis MRN: [id number] Date of Birth: 1956/04/06 Referring Provider: Soyla Dryer PA-C   Encounter Date: 12/02/2017  OT End of Session - 12/02/17 1631    Visit Number  19    Number of Visits  24    Date for OT Re-Evaluation  12/24/17    Authorization Type  [facility] Charity approval. Effective 09/24/17-03/27/18    OT Start Time  1605    OT Stop Time  1645    OT Time Calculation (min)  40 min    Activity Tolerance  Patient tolerated treatment well    Behavior During Therapy  WFL for tasks assessed/performed       Past Medical History:  Diagnosis Date  . Arthritis   . Chronic back pain   . Chronic shoulder pain   . Depression   . GERD (gastroesophageal reflux disease)   . Hypertension     Past Surgical History:  Procedure Laterality Date  . FOOT FRACTURE SURGERY Left   . REPLACEMENT TOTAL KNEE BILATERAL  2011  . ROTATOR CUFF REPAIR Right   . SPINAL FUSION      There were no vitals filed for this visit.  Subjective Assessment - 12/02/17 1604    Subjective   S: I had to use a heating pad over the weekend.     Currently in Pain?  Yes    Pain Score  5     Pain Location  Shoulder    Pain Orientation  Right    Pain Descriptors / Indicators  Aching;Sore    Pain Type  Acute pain    Pain Radiating Towards  goes to wrist    Pain Onset  More than a month ago    Pain Frequency  Intermittent    Aggravating Factors   movement, lifting    Pain Relieving Factors  biofreeze, heat    Effect of Pain on Daily Activities  min effect on ADLs    Multiple Pain Sites  No         [facility] OT Assessment - 12/02/17 1604      Assessment   Medical Diagnosis  right RTC repair      Precautions   Precautions  None    Precaution Comments  progress as tolerated               OT  Treatments/Exercises (OP) - 12/02/17 1605      Exercises   Exercises  Shoulder      Shoulder Exercises: Supine   Protraction  Strengthening;12 reps    Protraction Weight (lbs)  2    Horizontal ABduction  Strengthening;12 reps    Horizontal ABduction Weight (lbs)  2    External Rotation  Strengthening;12 reps    External Rotation Weight (lbs)  2    Internal Rotation  Strengthening;12 reps    Internal Rotation Weight (lbs)  2    Flexion  Strengthening;12 reps    Shoulder Flexion Weight (lbs)  2    ABduction  Strengthening;12 reps    Shoulder ABduction Weight (lbs)  2      Shoulder Exercises: Sidelying   External Rotation  Strengthening;10 reps    External Rotation Weight (lbs)  1    Internal Rotation  Strengthening;10 reps    Internal Rotation Weight (lbs)  1    Flexion  Strengthening;10 reps    Flexion Weight (lbs)  1    ABduction  Strengthening;10 reps    ABduction Weight (lbs)  1    Other Sidelying Exercises  Protraction; 1#, 10x    Other Sidelying Exercises  Horizontal abduction; 1#, 10X      Modalities   Modalities  Electrical Stimulation      Electrical Stimulation   Electrical Stimulation Location  right shoulder    Electrical Stimulation Action  interferential    Electrical Stimulation Parameters  5.6 CV    Electrical Stimulation Goals  Pain      Manual Therapy   Manual Therapy  Myofascial release    Manual therapy comments  Manual therapy completed prior to exercises.     Myofascial Release  Myofascial release and manual stretching completed to right upper arm, trapezius, and scapularis region to decrease pain and restrictions that are prohibiting end range mobiilty.                 OT Short Term Goals - 11/20/17 1208      OT SHORT TERM GOAL #1   Title  Patient will be educated and independent with HEP to faciliate progress in therapy and allow her to return to using her RUE for all daily tasks.     Time  3    Period  Weeks    Status  On-going       OT SHORT TERM GOAL #2   Title  Patient will increase P/ROM to WNL to increase ability to get shirts on and off with less difficulty.     Time  3    Period  Weeks      OT SHORT TERM GOAL #3   Title  Patient will increase RUE strength to 4-/5 to increase ability to complete activities at shoulder level with less difficulty.     Time  3    Period  Weeks    Status  Partially Met      OT SHORT TERM GOAL #4   Title  Patient will decrease pain level in RUE to approximately 3/10 or less when completing daily tasks.     Time  3    Period  Weeks    Status  On-going      OT SHORT TERM GOAL #5   Title  Patient will decrease fascial restrictions to min amount to increase functional mobility needed to fix and brush her hair using her RUE.     Time  3    Period  Weeks    Status  Achieved        OT Long Term Goals - 09/26/17 1103      OT LONG TERM GOAL #1   Title  Patient will return to highest level of independence with daily and leisure tasks while using her right UE as dominant extremity 100% of the time.    Time  6    Period  Weeks    Status  On-going      OT LONG TERM GOAL #2   Title  Patient will increase A/ROM to WNL to increase ability to reach overhead and out to the side with less difficulty.     Time  6    Period  Weeks    Status  On-going      OT LONG TERM GOAL #3   Title  Patient will increase RUE strength to 4+/5 to increase ability to complete heavy household chores and lifting tasks.  Time  6    Period  Weeks    Status  On-going      OT LONG TERM GOAL #4   Title  Patient will decrease RUE pain to 2/10 or less when completing daily tasks.     Time  6    Period  Weeks    Status  On-going            Plan - 12/02/17 1631    Clinical Impression Statement  A: Pt reporting soreness over weekend, using heating pad for pain management, unable to find home TENS unit. Continued with manual therapy to address fascial restrictions along anterior deltoid and  trapeizus regions causing increased pain. Resumed sidelying strengthening and continued with supine strengthening focusing on form and achieving full ROM. Verbal cuing for form and technique. Utilized ES at end of session for pain management.     Plan  P: continue with Sidelying strengthening and add to HEP if form is good. Continue with theraband activities focusing on shoulder and scapular stability       Patient will benefit from skilled therapeutic intervention in order to improve the following deficits and impairments:  Decreased activity tolerance, Decreased strength, Decreased range of motion, Pain, Impaired UE functional use, Increased fascial restrictions  Visit Diagnosis: Other symptoms and signs involving the musculoskeletal system  Acute pain of right shoulder  Stiffness of right shoulder, not elsewhere classified    Problem List Patient Active Problem List   Diagnosis Date Noted  . Essential hypertension 09/11/2017  . Chronic kidney disease 09/11/2017  . Anemia 09/11/2017  . Prediabetes 09/11/2017   Guadelupe Sabin, OTR/L  [phone] 12/02/2017, 5:29 PM  [facility] [address] [place], [place], [address] Phone: [phone]   Fax:  [phone]  Name: Melissa Solis MRN: [id number] Date of Birth: 11-01-1955

## 2017-12-03 ENCOUNTER — Ambulatory Visit (INDEPENDENT_AMBULATORY_CARE_PROVIDER_SITE_OTHER): Payer: Self-pay | Admitting: Orthopaedic Surgery

## 2017-12-04 ENCOUNTER — Encounter: Payer: Self-pay | Admitting: Physician Assistant

## 2017-12-04 ENCOUNTER — Ambulatory Visit (HOSPITAL_COMMUNITY): Payer: No Typology Code available for payment source | Admitting: Occupational Therapy

## 2017-12-04 ENCOUNTER — Encounter (HOSPITAL_COMMUNITY): Payer: Self-pay | Admitting: Occupational Therapy

## 2017-12-04 ENCOUNTER — Ambulatory Visit: Payer: Self-pay | Admitting: Physician Assistant

## 2017-12-04 VITALS — BP 142/92 | HR 77 | Temp 97.0°F | Ht 66.0 in | Wt 220.0 lb

## 2017-12-04 DIAGNOSIS — M25511 Pain in right shoulder: Secondary | ICD-10-CM

## 2017-12-04 DIAGNOSIS — R29898 Other symptoms and signs involving the musculoskeletal system: Secondary | ICD-10-CM

## 2017-12-04 DIAGNOSIS — I10 Essential (primary) hypertension: Secondary | ICD-10-CM

## 2017-12-04 DIAGNOSIS — M25611 Stiffness of right shoulder, not elsewhere classified: Secondary | ICD-10-CM

## 2017-12-04 DIAGNOSIS — R7303 Prediabetes: Secondary | ICD-10-CM

## 2017-12-04 MED ORDER — DICLOFENAC SODIUM 1 % TD GEL
2.0000 g | Freq: Four times a day (QID) | TRANSDERMAL | 1 refills | Status: DC | PRN
Start: 2017-12-04 — End: 2018-02-09

## 2017-12-04 MED ORDER — NABUMETONE 500 MG PO TABS: 500.0000 mg | ORAL_TABLET | Freq: Two times a day (BID) | ORAL | 1 refills | Status: DC | PRN

## 2017-12-04 NOTE — Patient Instructions (Signed)
Side Lying Exercises: Complete 10-15X each, 1-2x/day. Use a 1-2lb weight (soup can or water bottle)   1) Sidelying Flexion:   Lie on your side with your affected arm up. Start with the weight in your top hand by your side. Lift the arm forward and pull your shoulder blade down as the arm lifts up (like a seesaw). NO WEIGHT     2) Sidelying abduction:   Lie on your side with your arm down straight at your side.  Raise the arm up and overhead, keeping the elbow straight.  You may turn the palm forward and inward if you can.     3) Sidelying horizontal abduction:   Lie on your side with arm straight up towards ceiling. Lower the arm straight out to face the wall and bring back up towards ceiling.     4) Sidelying internal/external rotation:   Lie on side with elbow bent. Lower and raise forearm in direction of the ceiling, keeping elbow bent and by the side.

## 2017-12-04 NOTE — Patient Instructions (Signed)
Prediabetes Prediabetes is the condition of having a blood sugar (blood glucose) level that is higher than it should be, but not high enough for you to be diagnosed with type 2 diabetes. Having prediabetes puts you at risk for developing type 2 diabetes (type 2 diabetes mellitus). Prediabetes may be called impaired glucose tolerance or impaired fasting glucose. Prediabetes usually does not cause symptoms. Your health care provider can diagnose this condition with blood tests. You may be tested for prediabetes if you are overweight and if you have at least one other risk factor for prediabetes. Risk factors for prediabetes include:  Having a family member with type 2 diabetes.  Being overweight or obese.  Being older than age 45.  Being of American-Indian, African-American, Hispanic/Latino, or Asian/Pacific Islander descent.  Having an inactive (sedentary) lifestyle.  Having a history of gestational diabetes or polycystic ovarian syndrome (PCOS).  Having low levels of good cholesterol (HDL-C) or high levels of blood fats (triglycerides).  Having high blood pressure.  What is blood glucose and how is blood glucose measured?  Blood glucose refers to the amount of glucose in your bloodstream. Glucose comes from eating foods that contain sugars and starches (carbohydrates) that the body breaks down into glucose. Your blood glucose level may be measured in mg/dL (milligrams per deciliter) or mmol/L (millimoles per liter).Your blood glucose may be checked with one or more of the following blood tests:  A fasting blood glucose (FBG) test. You will not be allowed to eat (you will fast) for at least 8 hours before a blood sample is taken. ? A normal range for FBG is 70-100 mg/dl (3.9-5.6 mmol/L).  An A1c (hemoglobin A1c) blood test. This test provides information about blood glucose control over the previous 2?3months.  An oral glucose tolerance test (OGTT). This test measures your blood  glucose twice: ? After fasting. This is your baseline level. ? Two hours after you drink a beverage that contains glucose.  You may be diagnosed with prediabetes:  If your FBG is 100?125 mg/dL (5.6-6.9 mmol/L).  If your A1c level is 5.7?6.4%.  If your OGGT result is 140?199 mg/dL (7.8-11 mmol/L).  These blood tests may be repeated to confirm your diagnosis. What happens if blood glucose is too high? The pancreas produces a hormone (insulin) that helps move glucose from the bloodstream into cells. When cells in the body do not respond properly to insulin that the body makes (insulin resistance), excess glucose builds up in the blood instead of going into cells. As a result, high blood glucose (hyperglycemia) can develop, which can cause many complications. This is a symptom of prediabetes. What can happen if blood glucose stays higher than normal for a long time? Having high blood glucose for a long time is dangerous. Too much glucose in your blood can damage your nerves and blood vessels. Long-term damage can lead to complications from diabetes, which may include:  Heart disease.  Stroke.  Blindness.  Kidney disease.  Depression.  Poor circulation in the feet and legs, which could lead to surgical removal (amputation) in severe cases.  How can prediabetes be prevented from turning into type 2 diabetes?  To help prevent type 2 diabetes, take the following actions:  Be physically active. ? Do moderate-intensity physical activity for at least 30 minutes on at least 5 days of the week, or as much as told by your health care provider. This could be brisk walking, biking, or water aerobics. ? Ask your health care provider what   activities are safe for you. A mix of physical activities may be best, such as walking, swimming, cycling, and strength training.  Lose weight as told by your health care provider. ? Losing 5-7% of your body weight can reverse insulin resistance. ? Your health  care provider can determine how much weight loss is best for you and can help you lose weight safely.  Follow a healthy meal plan. This includes eating lean proteins, complex carbohydrates, fresh fruits and vegetables, low-fat dairy products, and healthy fats. ? Follow instructions from your health care provider about eating or drinking restrictions. ? Make an appointment to see a diet and nutrition specialist (registered dietitian) to help you create a healthy eating plan that is right for you.  Do not smoke or use any tobacco products, such as cigarettes, chewing tobacco, and e-cigarettes. If you need help quitting, ask your health care provider.  Take over-the-counter and prescription medicines as told by your health care provider. You may be prescribed medicines that help lower the risk of type 2 diabetes.  This information is not intended to replace advice given to you by your health care provider. Make sure you discuss any questions you have with your health care provider. Document Released: 10/09/2015 Document Revised: 11/23/2015 Document Reviewed: 08/08/2015 Elsevier Interactive Patient Education  2018 [employer]  

## 2017-12-04 NOTE — Progress Notes (Signed)
BP (!) 142/92 (BP Location: Left Arm, Patient Position: Sitting, Cuff Size: Large)   Pulse 77   Temp (!) 97 F (36.1 C) (Other (Comment))   Ht 5\' 6"  (1.676 m)   Wt 220 lb (99.8 kg)   SpO2 98%   BMI 35.51 kg/m    Subjective:    Patient ID: Melissa Solis, female    DOB: 06-May-1956, 62 y.o.   MRN: [id number]  HPI: Melissa Solis is a 62 y.o. female presenting on 12/04/2017 for Follow-up (from ER)   HPI   Pt is here today to follow up from her recent ER visit.  She went for shoulder pain.    Pt has had the same shoulder pain since her initial appointment here in March of this year.  Pt was referred for physical therapy and she was referred to orthopedics for evaluation of the shoulder which she recently had surgery on.  Pt was unhappy with whatever the orthopedist told her and wants to be sent to another orthopedist.   Discussed with pt that we have done what we can for her shoulder.  She was sent to a specialist and we have to support his recommendations.  Discussed that she is on charity care and it doesn't cover multiple doctor visits because she just didn't like what the specialist said.  The office note by Dr Ophelia CharterYates was reviewed.    Encouraged pt to continue with her physical therapy, that shoulders can be slow to return to their more normal function after surgery.      Pt says she gets Medicare starting in April 2020  Relevant past medical, surgical, family and social history reviewed and updated as indicated. Interim medical history since our last visit reviewed. Allergies and medications reviewed and updated.   Current Outpatient Medications:  .  baclofen (LIORESAL) 20 MG tablet, Take 1 tablet (20 mg total) by mouth 3 (three) times daily., Disp: 30 each, Rfl: 0 .  DOCUSATE SODIUM PO, Take 1 tablet by mouth as needed., Disp: , Rfl:  .  hydrochlorothiazide (MICROZIDE) 12.5 MG capsule, Take 1 capsule (12.5 mg total) by mouth daily., Disp: 30 capsule, Rfl: 1 .  metoprolol tartrate  (LOPRESSOR) 100 MG tablet, Take 1 tablet (100 mg total) by mouth 2 (two) times daily., Disp: 180 tablet, Rfl: 1 .  nabumetone (RELAFEN) 500 MG tablet, Take 500 mg by mouth 2 (two) times daily as needed for moderate pain. , Disp: , Rfl:  .  omeprazole (PRILOSEC) 40 MG capsule, Take 1 capsule (40 mg total) by mouth daily., Disp: 90 capsule, Rfl: 1 .  Iron Combinations (IRON COMPLEX) CAPS, daily (Patient not taking: Reported on 12/04/2017), Disp: , Rfl:  .  meloxicam (MOBIC) 15 MG tablet, Take 1 tablet (15 mg total) by mouth daily. (Patient not taking: Reported on 12/04/2017), Disp: 6 tablet, Rfl: 0   Review of Systems  Constitutional: Positive for appetite change, fatigue and unexpected weight change. Negative for chills, diaphoresis and fever.  HENT: Positive for congestion. Negative for dental problem, drooling, ear pain, facial swelling, hearing loss, mouth sores, sneezing, sore throat, trouble swallowing and voice change.   Eyes: Positive for redness. Negative for pain, discharge, itching and visual disturbance.  Respiratory: Negative for cough, choking, shortness of breath and wheezing.   Cardiovascular: Positive for palpitations and leg swelling. Negative for chest pain.  Gastrointestinal: Positive for constipation. Negative for abdominal pain, blood in stool, diarrhea and vomiting.  Endocrine: Negative for cold intolerance, heat intolerance and polydipsia.  Genitourinary: Negative for decreased urine volume, dysuria and hematuria.  Musculoskeletal: Positive for arthralgias, back pain and gait problem.  Skin: Negative for rash.  Allergic/Immunologic: Negative for environmental allergies.  Neurological: Negative for seizures, syncope, light-headedness and headaches.  Hematological: Negative for adenopathy.  Psychiatric/Behavioral: Positive for dysphoric mood. Negative for agitation and suicidal ideas. The patient is not nervous/anxious.     Per HPI unless specifically indicated above      Objective:    BP (!) 142/92 (BP Location: Left Arm, Patient Position: Sitting, Cuff Size: Large)   Pulse 77   Temp (!) 97 F (36.1 C) (Other (Comment))   Ht 5\' 6"  (1.676 m)   Wt 220 lb (99.8 kg)   SpO2 98%   BMI 35.51 kg/m   Wt Readings from Last 3 Encounters:  12/04/17 220 lb (99.8 kg)  11/20/17 220 lb (99.8 kg)  11/13/17 218 lb (98.9 kg)    Physical Exam  Constitutional: She appears well-developed and well-nourished.  HENT:  Head: Normocephalic and atraumatic.  Neck: Neck supple.  Cardiovascular: Normal rate and regular rhythm.  Pulmonary/Chest: Effort normal and breath sounds normal. No respiratory distress. She has no wheezes.  Musculoskeletal:       Right shoulder: She exhibits decreased range of motion and tenderness. She exhibits no bony tenderness, no swelling, no effusion and normal pulse.  No point tenderness.  Mild generalized tenderness.  Good rotation.  Limits abduction and extension to just over 90 degrees.   Nursing note and vitals reviewed.       Assessment & Plan:    Encounter Diagnoses  Name Primary?  . Right shoulder pain, unspecified chronicity Yes  . Essential hypertension   . Prediabetes      -see discussion above.  Will not refer pt to another orthopedist.   -pt is given rx relafen and diclofenac gel.  Discussed with pt that she is to avoid using both on the same day.   Recommended that she try one for several days and then she can try the other so she can decide which one helps her the most.   -pt to continue with physical therapy -counseled pt on prediabetes and gave handout -pt to Follow up August as scheduled.  She is to RTO sooner for any new symptoms or changes

## 2017-12-04 NOTE — Therapy (Signed)
[facility] [place], [place], [address] Phone: [phone]   Fax:  [phone]  Occupational Therapy Treatment  Patient Details  Name: Melissa Solis MRN: [id number] Date of Birth: 1956/04/29 Referring Provider: Soyla Dryer PA-C   Encounter Date: 12/04/2017  OT End of Session - 12/04/17 1601    Visit Number  20    Number of Visits  24    Date for OT Re-Evaluation  12/24/17    Authorization Type  [facility] approval. Effective 09/24/17-03/27/18    OT Start Time  1528 pt arrived late    OT Stop Time  1600    OT Time Calculation (min)  32 min    Activity Tolerance  Patient tolerated treatment well    Behavior During Therapy  WFL for tasks assessed/performed       Past Medical History:  Diagnosis Date  . Arthritis   . Chronic back pain   . Chronic shoulder pain   . Depression   . GERD (gastroesophageal reflux disease)   . Hypertension     Past Surgical History:  Procedure Laterality Date  . FOOT FRACTURE SURGERY Left   . REPLACEMENT TOTAL KNEE BILATERAL  2011  . ROTATOR CUFF REPAIR Right   . SPINAL FUSION      There were no vitals filed for this visit.  Subjective Assessment - 12/04/17 1528    Subjective   S: Last night it was terrible.     Currently in Pain?  Yes    Pain Score  4     Pain Location  Shoulder    Pain Orientation  Right    Pain Descriptors / Indicators  Aching;Sore    Pain Type  Acute pain    Pain Radiating Towards  goes to wrist    Pain Onset  More than a month ago    Pain Frequency  Intermittent    Aggravating Factors   movement, lifting    Pain Relieving Factors  biofreeze, heat    Effect of Pain on Daily Activities  min effect on ADLs    Multiple Pain Sites  No         [facility] OT Assessment - 12/04/17 1527      Assessment   Medical Diagnosis  right RTC repair      Precautions   Precautions  None    Precaution Comments  progress as tolerated               OT  Treatments/Exercises (OP) - 12/04/17 1531      Exercises   Exercises  Shoulder      Shoulder Exercises: Supine   Protraction  Strengthening;12 reps    Protraction Weight (lbs)  2    Horizontal ABduction  Strengthening;12 reps    Horizontal ABduction Weight (lbs)  2    External Rotation  Strengthening;12 reps    External Rotation Weight (lbs)  2    Internal Rotation  Strengthening;12 reps    Internal Rotation Weight (lbs)  2    Flexion  Strengthening;12 reps    Shoulder Flexion Weight (lbs)  2    ABduction  Strengthening;12 reps    Shoulder ABduction Weight (lbs)  2      Shoulder Exercises: Sidelying   External Rotation  Strengthening;12 reps    External Rotation Weight (lbs)  1    Internal Rotation  Strengthening;12 reps    Internal Rotation Weight (lbs)  1    Flexion  Strengthening;12 reps  Flexion Weight (lbs)  1    ABduction  Strengthening;12 reps    ABduction Weight (lbs)  1    Other Sidelying Exercises  Protraction; 1#, 12x    Other Sidelying Exercises  Horizontal abduction; 1#, 12X      Shoulder Exercises: Standing   Protraction  Theraband;10 reps    Theraband Level (Shoulder Protraction)  Level 2 (Red)    Horizontal ABduction  Theraband;10 reps    Theraband Level (Shoulder Horizontal ABduction)  Level 2 (Red)    External Rotation  Theraband;10 reps    Theraband Level (Shoulder External Rotation)  Level 2 (Red)    Flexion  Theraband;10 reps    Theraband Level (Shoulder Flexion)  Level 2 (Red)    Extension  Theraband;15 reps    Theraband Level (Shoulder Extension)  Level 2 (Red)    Row  Theraband;15 reps    Theraband Level (Shoulder Row)  Level 2 (Red)    Retraction  Theraband;15 reps    Theraband Level (Shoulder Retraction)  Level 2 (Red)      Shoulder Exercises: ROM/Strengthening   UBE (Upper Arm Bike)  Level 1 2' forward 2' reverse Pace: 4.5    X to V Arms  10X, 1#             OT Education - 12/04/17 1542    Education provided  Yes    Education  Details  sidelying A/ROM and strengthening    Person(s) Educated  Patient    Methods  Explanation;Demonstration;Handout    Comprehension  Verbalized understanding;Returned demonstration       OT Short Term Goals - 11/20/17 1208      OT SHORT TERM GOAL #1   Title  Patient will be educated and independent with HEP to faciliate progress in therapy and allow her to return to using her RUE for all daily tasks.     Time  3    Period  Weeks    Status  On-going      OT SHORT TERM GOAL #2   Title  Patient will increase P/ROM to WNL to increase ability to get shirts on and off with less difficulty.     Time  3    Period  Weeks      OT SHORT TERM GOAL #3   Title  Patient will increase RUE strength to 4-/5 to increase ability to complete activities at shoulder level with less difficulty.     Time  3    Period  Weeks    Status  Partially Met      OT SHORT TERM GOAL #4   Title  Patient will decrease pain level in RUE to approximately 3/10 or less when completing daily tasks.     Time  3    Period  Weeks    Status  On-going      OT SHORT TERM GOAL #5   Title  Patient will decrease fascial restrictions to min amount to increase functional mobility needed to fix and brush her hair using her RUE.     Time  3    Period  Weeks    Status  Achieved        OT Long Term Goals - 09/26/17 1103      OT LONG TERM GOAL #1   Title  Patient will return to highest level of independence with daily and leisure tasks while using her right UE as dominant extremity 100% of the time.    Time  6  Period  Weeks    Status  On-going      OT LONG TERM GOAL #2   Title  Patient will increase A/ROM to WNL to increase ability to reach overhead and out to the side with less difficulty.     Time  6    Period  Weeks    Status  On-going      OT LONG TERM GOAL #3   Title  Patient will increase RUE strength to 4+/5 to increase ability to complete heavy household chores and lifting tasks.     Time  6    Period   Weeks    Status  On-going      OT LONG TERM GOAL #4   Title  Patient will decrease RUE pain to 2/10 or less when completing daily tasks.     Time  6    Period  Weeks    Status  On-going            Plan - 12/04/17 1558    Clinical Impression Statement  A: Pt reports the free clinic will not send her to another orthopedic MD. ES helped with pain last session. Added theraband strengthening, pt with max difficulty performing flexion with band. Continued with strengthening in supine and sidelying, attempted w arms however unable to complete due to discomfort. Verbal cuing for form and technique during exercises, mod to max fatigue at end of session.     Plan  P: Follow up on HEP. Continue working on improving theraband exercises and activity tolerance, resume therapy ball exercises       Patient will benefit from skilled therapeutic intervention in order to improve the following deficits and impairments:  Decreased activity tolerance, Decreased strength, Decreased range of motion, Pain, Impaired UE functional use, Increased fascial restrictions  Visit Diagnosis: Other symptoms and signs involving the musculoskeletal system  Acute pain of right shoulder  Stiffness of right shoulder, not elsewhere classified    Problem List Patient Active Problem List   Diagnosis Date Noted  . Essential hypertension 09/11/2017  . Chronic kidney disease 09/11/2017  . Anemia 09/11/2017  . Prediabetes 09/11/2017   Guadelupe Sabin, OTR/L  [phone] 12/04/2017, 4:01 PM  [facility] [address] [place], [place], [address] Phone: [phone]   Fax:  [phone]  Name: Charmain Diosdado MRN: [id number] Date of Birth: 11/11/1955

## 2017-12-10 ENCOUNTER — Ambulatory Visit (HOSPITAL_COMMUNITY): Payer: No Typology Code available for payment source | Admitting: Occupational Therapy

## 2017-12-10 ENCOUNTER — Encounter (HOSPITAL_COMMUNITY): Payer: Self-pay | Admitting: Occupational Therapy

## 2017-12-10 DIAGNOSIS — R29898 Other symptoms and signs involving the musculoskeletal system: Secondary | ICD-10-CM

## 2017-12-10 DIAGNOSIS — M25611 Stiffness of right shoulder, not elsewhere classified: Secondary | ICD-10-CM

## 2017-12-10 DIAGNOSIS — M25511 Pain in right shoulder: Secondary | ICD-10-CM

## 2017-12-10 NOTE — Therapy (Signed)
[facility] [place], [place], [address] Phone: [phone]   Fax:  [phone]  Occupational Therapy Treatment  Patient Details  Name: Melissa Solis MRN: [id number] Date of Birth: [birth year] Referring Provider: Soyla Dryer PA-C   Encounter Date: 12/10/2017  OT End of Session - 12/10/17 1538    Visit Number  21    Number of Visits  24    Date for OT Re-Evaluation  12/24/17    Authorization Type  [facility] Charity approval. Effective 09/24/17-03/27/18    OT Start Time  1353 pt arrived late    OT Stop Time  1426    OT Time Calculation (min)  33 min    Activity Tolerance  Patient tolerated treatment well    Behavior During Therapy  WFL for tasks assessed/performed       Past Medical History:  Diagnosis Date  . Arthritis   . Chronic back pain   . Chronic shoulder pain   . Depression   . GERD (gastroesophageal reflux disease)   . Hypertension     Past Surgical History:  Procedure Laterality Date  . FOOT FRACTURE SURGERY Left   . REPLACEMENT TOTAL KNEE BILATERAL  2011  . ROTATOR CUFF REPAIR Right   . SPINAL FUSION      There were no vitals filed for this visit.  Subjective Assessment - 12/10/17 1355    Subjective   S: I've been doing my swimming lessons.    Currently in Pain?  Yes    Pain Score  5     Pain Location  Shoulder    Pain Orientation  Right    Pain Descriptors / Indicators  Aching;Sore    Pain Type  Acute pain    Pain Radiating Towards  goes to wrist    Pain Onset  More than a month ago    Pain Frequency  Intermittent    Aggravating Factors   movement, lifting    Pain Relieving Factors  biofreeze, heat    Effect of Pain on Daily Activities  min effect on ADLs    Multiple Pain Sites  No         [facility] OT Assessment - 12/10/17 1355      Assessment   Medical Diagnosis  right RTC repair      Precautions   Precautions  None    Precaution Comments  progress as tolerated                OT Treatments/Exercises (OP) - 12/10/17 1356      Exercises   Exercises  Shoulder      Shoulder Exercises: Supine   Protraction  Strengthening;10 reps    Protraction Weight (lbs)  3    Horizontal ABduction  Strengthening;10 reps    Horizontal ABduction Weight (lbs)  2    External Rotation  Strengthening;10 reps abducted    External Rotation Weight (lbs)  2    Internal Rotation  Strengthening;10 reps abducted    Internal Rotation Weight (lbs)  2    Flexion  Strengthening;10 reps    Shoulder Flexion Weight (lbs)  2    ABduction  Strengthening;10 reps    Shoulder ABduction Weight (lbs)  2      Shoulder Exercises: Sidelying   External Rotation  Strengthening;12 reps    External Rotation Weight (lbs)  2    Internal Rotation  Strengthening;12 reps    Internal Rotation Weight (lbs)  2    Flexion  Strengthening;12 reps    Flexion Weight (lbs)  2    ABduction  Strengthening;12 reps    ABduction Weight (lbs)  2    Other Sidelying Exercises  Protraction; 2#, 12x    Other Sidelying Exercises  Horizontal abduction; 2#, 12X      Shoulder Exercises: Standing   Protraction  Theraband;10 reps    Theraband Level (Shoulder Protraction)  Level 2 (Red)    Horizontal ABduction  Theraband;10 reps    Theraband Level (Shoulder Horizontal ABduction)  Level 2 (Red)    External Rotation  Theraband;10 reps    Theraband Level (Shoulder External Rotation)  Level 2 (Red)    Flexion  Theraband;10 reps    Theraband Level (Shoulder Flexion)  Level 2 (Red)    ABduction  Theraband;10 reps    Theraband Level (Shoulder ABduction)  Level 2 (Red)      Shoulder Exercises: Therapy Ball   Other Therapy Ball Exercises  green ball: chest press, overhead press, flexion, diagonals, 10X each      Shoulder Exercises: ROM/Strengthening   Ball on Wall  1' flexion 1' abduction green ball      Modalities   Modalities  Electrical Stimulation      Electrical Stimulation   Electrical Stimulation  Location  right shoulder    Electrical Stimulation Action  interferential     Electrical Stimulation Parameters  5.6 CV    Electrical Stimulation Goals  Pain               OT Short Term Goals - 11/20/17 1208      OT SHORT TERM GOAL #1   Title  Patient will be educated and independent with HEP to faciliate progress in therapy and allow her to return to using her RUE for all daily tasks.     Time  3    Period  Weeks    Status  On-going      OT SHORT TERM GOAL #2   Title  Patient will increase P/ROM to WNL to increase ability to get shirts on and off with less difficulty.     Time  3    Period  Weeks      OT SHORT TERM GOAL #3   Title  Patient will increase RUE strength to 4-/5 to increase ability to complete activities at shoulder level with less difficulty.     Time  3    Period  Weeks    Status  Partially Met      OT SHORT TERM GOAL #4   Title  Patient will decrease pain level in RUE to approximately 3/10 or less when completing daily tasks.     Time  3    Period  Weeks    Status  On-going      OT SHORT TERM GOAL #5   Title  Patient will decrease fascial restrictions to min amount to increase functional mobility needed to fix and brush her hair using her RUE.     Time  3    Period  Weeks    Status  Achieved        OT Long Term Goals - 09/26/17 1103      OT LONG TERM GOAL #1   Title  Patient will return to highest level of independence with daily and leisure tasks while using her right UE as dominant extremity 100% of the time.    Time  6    Period  Weeks    Status  On-going  OT LONG TERM GOAL #2   Title  Patient will increase A/ROM to WNL to increase ability to reach overhead and out to the side with less difficulty.     Time  6    Period  Weeks    Status  On-going      OT LONG TERM GOAL #3   Title  Patient will increase RUE strength to 4+/5 to increase ability to complete heavy household chores and lifting tasks.     Time  6    Period  Weeks     Status  On-going      OT LONG TERM GOAL #4   Title  Patient will decrease RUE pain to 2/10 or less when completing daily tasks.     Time  6    Period  Weeks    Status  On-going            Plan - 12/10/17 1538    Clinical Impression Statement  A: Pt reporting she is completing swimming lessons without much difficulty. Continued with strengthening working on improving activity tolerance and form during exercises. Pt with max fatigue during therapy ball exercises, able to complete with rest breaks. Also continues to have diffculty with red theraband strengthening exercises. Verbal cuing for form and technique. ES applied after session for pain management.     Plan  P: Continue working on improving completion and tolerance with theraband exercises.        Patient will benefit from skilled therapeutic intervention in order to improve the following deficits and impairments:  Decreased activity tolerance, Decreased strength, Decreased range of motion, Pain, Impaired UE functional use, Increased fascial restrictions  Visit Diagnosis: Other symptoms and signs involving the musculoskeletal system  Acute pain of right shoulder  Stiffness of right shoulder, not elsewhere classified    Problem List Patient Active Problem List   Diagnosis Date Noted  . Essential hypertension 09/11/2017  . Chronic kidney disease 09/11/2017  . Anemia 09/11/2017  . Prediabetes 09/11/2017   Guadelupe Sabin, OTR/L  [phone] 12/10/2017, 3:42 PM  [facility] [address] [place], [place], [address] Phone: [phone]   Fax:  [phone]  Name: Melissa Solis MRN: [id number] Date of Birth: 1956-01-20

## 2017-12-12 ENCOUNTER — Ambulatory Visit (HOSPITAL_COMMUNITY): Payer: No Typology Code available for payment source | Admitting: Occupational Therapy

## 2017-12-12 ENCOUNTER — Encounter (HOSPITAL_COMMUNITY): Payer: Self-pay | Admitting: Occupational Therapy

## 2017-12-12 DIAGNOSIS — R29898 Other symptoms and signs involving the musculoskeletal system: Secondary | ICD-10-CM

## 2017-12-12 DIAGNOSIS — M25511 Pain in right shoulder: Secondary | ICD-10-CM

## 2017-12-12 DIAGNOSIS — M25611 Stiffness of right shoulder, not elsewhere classified: Secondary | ICD-10-CM

## 2017-12-12 NOTE — Therapy (Signed)
[facility], [place], [address] Phone: [phone]   Fax:  [phone]  Occupational Therapy Treatment  Patient Details  Name: Melissa Solis MRN: [id number] Date of Birth: May 23, 1956 Referring Provider: Soyla Dryer PA-C   Encounter Date: 12/12/2017  OT End of Session - 12/12/17 1607    Visit Number  22    Number of Visits  24    Date for OT Re-Evaluation  12/24/17    Authorization Type  [facility] approval. Effective 09/24/17-03/27/18    OT Start Time  1519    OT Stop Time  1606    OT Time Calculation (min)  47 min    Activity Tolerance  Patient tolerated treatment well    Behavior During Therapy  WFL for tasks assessed/performed       Past Medical History:  Diagnosis Date  . Arthritis   . Chronic back pain   . Chronic shoulder pain   . Depression   . GERD (gastroesophageal reflux disease)   . Hypertension     Past Surgical History:  Procedure Laterality Date  . FOOT FRACTURE SURGERY Left   . REPLACEMENT TOTAL KNEE BILATERAL  2011  . ROTATOR CUFF REPAIR Right   . SPINAL FUSION      There were no vitals filed for this visit.  Subjective Assessment - 12/12/17 1519    Subjective   S: It still hurts in the front of my shoulder.     Currently in Pain?  Yes    Pain Score  3     Pain Location  Shoulder    Pain Orientation  Right    Pain Descriptors / Indicators  Aching;Sore    Pain Type  Acute pain    Pain Radiating Towards  goes to wrist    Pain Onset  More than a month ago    Pain Frequency  Intermittent    Aggravating Factors   movement, lifting    Pain Relieving Factors  biofreeze, heat    Effect of Pain on Daily Activities  min effect on ADLs    Multiple Pain Sites  No         [facility] OT Assessment - 12/12/17 1519      Assessment   Medical Diagnosis  right RTC repair      Precautions   Precautions  None    Precaution Comments  progress as tolerated               OT  Treatments/Exercises (OP) - 12/12/17 1521      Exercises   Exercises  Shoulder      Shoulder Exercises: Supine   Protraction  Strengthening;15 reps    Protraction Weight (lbs)  2    Horizontal ABduction  Strengthening;15 reps    Horizontal ABduction Weight (lbs)  2    External Rotation  Strengthening;15 reps abducted    External Rotation Weight (lbs)  2    Internal Rotation  Strengthening;15 reps abducted    Internal Rotation Weight (lbs)  2    Flexion  Strengthening;15 reps    Shoulder Flexion Weight (lbs)  2    ABduction  Strengthening;15 reps    Shoulder ABduction Weight (lbs)  2      Shoulder Exercises: Sidelying   External Rotation  Theraband;12 reps    Theraband Level (Shoulder External Rotation)  Level 2 (Red)    Internal Rotation  Theraband;12 reps    Theraband Level (Shoulder Internal Rotation)  Level  2 (Red)    Flexion  Theraband;12 reps    Theraband Level (Shoulder Flexion)  Level 2 (Red)    ABduction  Theraband;12 reps    Theraband Level (Shoulder ABduction)  Level 2 (Red)    Other Sidelying Exercises  Protraction; theraband red, 12x    Other Sidelying Exercises  Horizontal abduction; theraband red, 12X      Shoulder Exercises: Standing   Protraction  Theraband;12 reps    Theraband Level (Shoulder Protraction)  Level 2 (Red)    Horizontal ABduction  Theraband;12 reps    Theraband Level (Shoulder Horizontal ABduction)  Level 2 (Red)    External Rotation  Theraband;12 reps    Theraband Level (Shoulder External Rotation)  Level 2 (Red)      Shoulder Exercises: ROM/Strengthening   Over Head Lace  1', 1#    X to V Arms  12X, 1#    Ball on Wall  1' flexion 1' abduction green ball      Modalities   Modalities  Electrical Stimulation      Electrical Stimulation   Electrical Stimulation Location  right shoulder    Electrical Stimulation Action  interferential    Electrical Stimulation Parameters  5.0 CV    Electrical Stimulation Goals  Pain                OT Short Term Goals - 11/20/17 1208      OT SHORT TERM GOAL #1   Title  Patient will be educated and independent with HEP to faciliate progress in therapy and allow her to return to using her RUE for all daily tasks.     Time  3    Period  Weeks    Status  On-going      OT SHORT TERM GOAL #2   Title  Patient will increase P/ROM to WNL to increase ability to get shirts on and off with less difficulty.     Time  3    Period  Weeks      OT SHORT TERM GOAL #3   Title  Patient will increase RUE strength to 4-/5 to increase ability to complete activities at shoulder level with less difficulty.     Time  3    Period  Weeks    Status  Partially Met      OT SHORT TERM GOAL #4   Title  Patient will decrease pain level in RUE to approximately 3/10 or less when completing daily tasks.     Time  3    Period  Weeks    Status  On-going      OT SHORT TERM GOAL #5   Title  Patient will decrease fascial restrictions to min amount to increase functional mobility needed to fix and brush her hair using her RUE.     Time  3    Period  Weeks    Status  Achieved        OT Long Term Goals - 09/26/17 1103      OT LONG TERM GOAL #1   Title  Patient will return to highest level of independence with daily and leisure tasks while using her right UE as dominant extremity 100% of the time.    Time  6    Period  Weeks    Status  On-going      OT LONG TERM GOAL #2   Title  Patient will increase A/ROM to WNL to increase ability to reach overhead and out to  the side with less difficulty.     Time  6    Period  Weeks    Status  On-going      OT LONG TERM GOAL #3   Title  Patient will increase RUE strength to 4+/5 to increase ability to complete heavy household chores and lifting tasks.     Time  6    Period  Weeks    Status  On-going      OT LONG TERM GOAL #4   Title  Patient will decrease RUE pain to 2/10 or less when completing daily tasks.     Time  6    Period  Weeks     Status  On-going            Plan - 12/12/17 1610    Clinical Impression Statement  A: Pt reporting slight decrease in pain/soreness since last session. Added theraband in sidelying, pt with max difficulty completing due to pain and weakness. Pt continues to struggle with form during all exercises, cuing for straightening elbow and using shoulder versus biceps. Occasional rest breaks during session for fatigue. Pt requesting ES, applied at end of session for pain management.     Plan  P: Continue working on improving form with exercise completion       Patient will benefit from skilled therapeutic intervention in order to improve the following deficits and impairments:  Decreased activity tolerance, Decreased strength, Decreased range of motion, Pain, Impaired UE functional use, Increased fascial restrictions  Visit Diagnosis: Other symptoms and signs involving the musculoskeletal system  Acute pain of right shoulder  Stiffness of right shoulder, not elsewhere classified    Problem List Patient Active Problem List   Diagnosis Date Noted  . Essential hypertension 09/11/2017  . Chronic kidney disease 09/11/2017  . Anemia 09/11/2017  . Prediabetes 09/11/2017   Guadelupe Sabin, OTR/L  [phone] 12/12/2017, 4:12 PM  [facility] [address] [place], [place], [address] Phone: [phone]   Fax:  [phone]  Name: Melissa Solis MRN: [id number] Date of Birth: 31-Mar-1956

## 2017-12-16 ENCOUNTER — Encounter (HOSPITAL_COMMUNITY): Payer: Self-pay

## 2017-12-16 ENCOUNTER — Ambulatory Visit (HOSPITAL_COMMUNITY): Payer: No Typology Code available for payment source

## 2017-12-16 ENCOUNTER — Other Ambulatory Visit: Payer: Self-pay

## 2017-12-16 DIAGNOSIS — R29898 Other symptoms and signs involving the musculoskeletal system: Secondary | ICD-10-CM

## 2017-12-16 DIAGNOSIS — M25611 Stiffness of right shoulder, not elsewhere classified: Secondary | ICD-10-CM

## 2017-12-16 DIAGNOSIS — M25511 Pain in right shoulder: Secondary | ICD-10-CM

## 2017-12-16 NOTE — Therapy (Addendum)
[facility] [place], [place], [address] Phone: [phone]   Fax:  [phone]  Occupational Therapy Treatment  Patient Details  Name: Melissa Solis MRN: [id number] Date of Birth: 12-31-1955 Referring Provider: Soyla Dryer PA-C   Encounter Date: 12/16/2017  OT End of Session - 12/16/17 1451    Visit Number  23    Number of Visits  24    Date for OT Re-Evaluation  12/24/17    Authorization Type  [facility] approval. Effective 09/24/17-03/27/18    OT Start Time  1441 patient arrived late    OT Stop Time  1529    OT Time Calculation (min)  48 min    Activity Tolerance  Patient tolerated treatment well    Behavior During Therapy  WFL for tasks assessed/performed       Past Medical History:  Diagnosis Date  . Arthritis   . Chronic back pain   . Chronic shoulder pain   . Depression   . GERD (gastroesophageal reflux disease)   . Hypertension     Past Surgical History:  Procedure Laterality Date  . FOOT FRACTURE SURGERY Left   . REPLACEMENT TOTAL KNEE BILATERAL  2011  . ROTATOR CUFF REPAIR Right   . SPINAL FUSION      There were no vitals filed for this visit.  Subjective Assessment - 12/16/17 1450    Subjective   S: I did some exercises earlier and I think I did too much.    Currently in Pain?  Yes    Pain Score  5     Pain Location  Shoulder    Pain Orientation  Right    Pain Descriptors / Indicators  Aching;Sore    Pain Type  Acute pain    Pain Radiating Towards  N/A    Pain Onset  More than a month ago    Pain Frequency  Intermittent    Aggravating Factors   movement, lifting    Pain Relieving Factors  biofreeze, heat    Effect of Pain on Daily Activities  minimal effect on ADLs    Multiple Pain Sites  No         [facility] OT Assessment - 12/16/17 1451      Assessment   Medical Diagnosis  right RTC repair      Precautions   Precautions  None    Precaution Comments  progress as tolerated                OT Treatments/Exercises (OP) - 12/16/17 1446      Exercises   Exercises  Shoulder      Shoulder Exercises: Supine   Horizontal ABduction  PROM 3 reps    External Rotation  PROM 3 reps    Internal Rotation  PROM 3 reps    Flexion  PROM 3 reps      Shoulder Exercises: Sidelying   External Rotation  Strengthening;15 reps    External Rotation Weight (lbs)  2    Internal Rotation  Strengthening;15 reps    Internal Rotation Weight (lbs)  2    Flexion  Strengthening;15 reps    Flexion Weight (lbs)  2    ABduction  Strengthening;15 reps    ABduction Weight (lbs)  2    Other Sidelying Exercises  Protraction; strengthening; 2# 15x    Other Sidelying Exercises  Horizontal abduction; strengthening; 1#, 15X Attempted 2# but patient reported it was too heavy  Shoulder Exercises: ROM/Strengthening   X to V Arms  15X, 1#    Ball on Wall  1' flexion 1' abduction green ball      Electrical Stimulation   Electrical Stimulation Location  right shoulder    Electrical Stimulation Action  interferential    Electrical Stimulation Parameters  4.7 CV    Electrical Stimulation Goals  Pain      Manual Therapy   Manual Therapy  Myofascial release    Manual therapy comments  Manual therapy completed prior to exercises.     Myofascial Release  Myofascial release and manual stretching completed to right upper arm, trapezius, and scapularis region to decrease pain and restrictions that are prohibiting end range mobiilty.               OT Education - 12/16/17 1455    Education provided  Yes    Education Details  Patient educated on using a tennis ball to work out fascial restrictions at home. HEP updated to include sidelying strengthening exercises per patient request.    Person(s) Educated  Patient    Methods  Explanation;Demonstration;Verbal cues;Handout    Comprehension  Verbalized understanding;Returned demonstration       OT Short Term Goals - 11/20/17 1208       OT SHORT TERM GOAL #1   Title  Patient will be educated and independent with HEP to faciliate progress in therapy and allow her to return to using her RUE for all daily tasks.     Time  3    Period  Weeks    Status  On-going      OT SHORT TERM GOAL #2   Title  Patient will increase P/ROM to WNL to increase ability to get shirts on and off with less difficulty.     Time  3    Period  Weeks      OT SHORT TERM GOAL #3   Title  Patient will increase RUE strength to 4-/5 to increase ability to complete activities at shoulder level with less difficulty.     Time  3    Period  Weeks    Status  Partially Met      OT SHORT TERM GOAL #4   Title  Patient will decrease pain level in RUE to approximately 3/10 or less when completing daily tasks.     Time  3    Period  Weeks    Status  On-going      OT SHORT TERM GOAL #5   Title  Patient will decrease fascial restrictions to min amount to increase functional mobility needed to fix and brush her hair using her RUE.     Time  3    Period  Weeks    Status  Achieved        OT Long Term Goals - 09/26/17 1103      OT LONG TERM GOAL #1   Title  Patient will return to highest level of independence with daily and leisure tasks while using her right UE as dominant extremity 100% of the time.    Time  6    Period  Weeks    Status  On-going      OT LONG TERM GOAL #2   Title  Patient will increase A/ROM to WNL to increase ability to reach overhead and out to the side with less difficulty.     Time  6    Period  Weeks    Status  On-going      OT LONG TERM GOAL #3   Title  Patient will increase RUE strength to 4+/5 to increase ability to complete heavy household chores and lifting tasks.     Time  6    Period  Weeks    Status  On-going      OT LONG TERM GOAL #4   Title  Patient will decrease RUE pain to 2/10 or less when completing daily tasks.     Time  6    Period  Weeks    Status  On-going            Plan - 12/16/17 1454     Clinical Impression Statement  A: Patient reports increased pain this session following exercises she did earlier in the day. Manual therapy performed upon patient request. Minimal fascial restrictions palpated in upper trapezius region. Patient continued with sidelying strengthening and shoulder stability exercises. Patient experiences moderate pain during decent from flexion and abduction. Pt requesting ES, applied at end of session for pain management. Patient reports decreased pain of 2/10 following ES.   Plan  P: Continue with strengthening in sidelying and add strengthening in standing.      Patient will benefit from skilled therapeutic intervention in order to improve the following deficits and impairments:  Decreased activity tolerance, Decreased strength, Decreased range of motion, Pain, Impaired UE functional use, Increased fascial restrictions  Visit Diagnosis: Other symptoms and signs involving the musculoskeletal system  Acute pain of right shoulder  Stiffness of right shoulder, not elsewhere classified    Problem List Patient Active Problem List   Diagnosis Date Noted  . Essential hypertension 09/11/2017  . Chronic kidney disease 09/11/2017  . Anemia 09/11/2017  . Prediabetes 09/11/2017    Roderic Palau, OT student 12/16/2017, 4:19 PM  [facility] [place], [place], [address] Phone: [phone]   Fax:  [phone]  Name: Melissa Solis MRN: [id number] Date of Birth: [birth year]

## 2017-12-16 NOTE — Patient Instructions (Signed)
Side Lying Exercises: Complete 10-15X each, 1-2x/day   1) Sidelying Flexion:   Lie on your side with your affected arm up. Start with the weight in your top hand by your side. Lift the arm forward and pull your shoulder blade down as the arm lifts up (like a seesaw).      2) Sidelying abduction:   Lie on your side with your arm down straight at your side.  Raise the arm up and overhead, keeping the elbow straight.  You may turn the palm forward and inward if you can.     3) Sidelying horizontal abduction:   Lie on your side with arm straight up towards ceiling. Lower the arm straight out to face the wall and bring back up towards ceiling.     4) Sidelying internal/external rotation:   Lie on side with elbow bent. Lower and raise forearm in direction of the ceiling, keeping elbow bent and by the side.

## 2017-12-17 ENCOUNTER — Encounter (HOSPITAL_COMMUNITY): Payer: Self-pay | Admitting: *Deleted

## 2017-12-18 ENCOUNTER — Telehealth (HOSPITAL_COMMUNITY): Payer: Self-pay | Admitting: Physical Therapy

## 2017-12-18 ENCOUNTER — Ambulatory Visit (HOSPITAL_COMMUNITY): Payer: No Typology Code available for payment source | Admitting: Occupational Therapy

## 2017-12-18 NOTE — Telephone Encounter (Signed)
Pt called to cancel appointment due to not feeling well. Lurena NidaAmy B Jackolyn Geron, PTA/CLT [phone]

## 2017-12-23 ENCOUNTER — Telehealth (HOSPITAL_COMMUNITY): Payer: Self-pay | Admitting: Occupational Therapy

## 2017-12-23 ENCOUNTER — Ambulatory Visit (HOSPITAL_COMMUNITY): Payer: No Typology Code available for payment source | Admitting: Occupational Therapy

## 2017-12-23 NOTE — Telephone Encounter (Signed)
Pt called said she slept thur the apptmet time today/ r/s for tormorrow. NF 12/23/17 @ 3:08

## 2017-12-24 ENCOUNTER — Encounter (INDEPENDENT_AMBULATORY_CARE_PROVIDER_SITE_OTHER): Payer: Self-pay | Admitting: Orthopaedic Surgery

## 2017-12-24 ENCOUNTER — Encounter (HOSPITAL_COMMUNITY): Payer: Self-pay | Admitting: Occupational Therapy

## 2017-12-24 ENCOUNTER — Ambulatory Visit (HOSPITAL_COMMUNITY): Payer: No Typology Code available for payment source | Admitting: Occupational Therapy

## 2017-12-24 DIAGNOSIS — M25611 Stiffness of right shoulder, not elsewhere classified: Secondary | ICD-10-CM

## 2017-12-24 DIAGNOSIS — M25511 Pain in right shoulder: Secondary | ICD-10-CM

## 2017-12-24 DIAGNOSIS — R29898 Other symptoms and signs involving the musculoskeletal system: Secondary | ICD-10-CM

## 2017-12-24 NOTE — Therapy (Signed)
[place], [place], [address] Phone: [phone]   Fax:  [phone]  Occupational Therapy Reassessment, Treatment, Discharge Summary  Patient Details  Name: Melissa Solis MRN: [id number] Date of Birth: 1955-07-03 Referring Provider: Soyla Dryer PA-C   Encounter Date: 12/24/2017  OT End of Session - 12/24/17 1346    Visit Number  24    Number of Visits  24    Date for OT Re-Evaluation  12/24/17    Authorization Type  [facility] [employer]ity approval. Effective 09/24/17-03/27/18    OT Start Time  1300    OT Stop Time  1330    OT Time Calculation (min)  30 min    Activity Tolerance  Patient tolerated treatment well    Behavior During Therapy  WFL for tasks assessed/performed       Past Medical History:  Diagnosis Date  . Arthritis   . Chronic back pain   . Chronic shoulder pain   . Depression   . GERD (gastroesophageal reflux disease)   . Hypertension     Past Surgical History:  Procedure Laterality Date  . FOOT FRACTURE SURGERY Left   . REPLACEMENT TOTAL KNEE BILATERAL  2011  . ROTATOR CUFF REPAIR Right   . SPINAL FUSION      There were no vitals filed for this visit.  Subjective Assessment - 12/24/17 1259    Subjective   S: I got pain all the way down to my wrist.     Currently in Pain?  Yes    Pain Score  6     Pain Location  Shoulder    Pain Orientation  Right    Pain Descriptors / Indicators  Aching;Sore    Pain Type  Acute pain    Pain Radiating Towards  N/A    Pain Onset  More than a month ago    Pain Frequency  Intermittent    Aggravating Factors   movement, lifting    Pain Relieving Factors  biofreeze, heat    Effect of Pain on Daily Activities  minimal effect on ADLs    Multiple Pain Sites  No         [facility] OT Assessment - 12/24/17 1259      Assessment   Medical Diagnosis  right RTC repair      Precautions   Precautions  None    Precaution Comments  progress as tolerated       AROM   Overall AROM Comments  Assessed seated. IR/er adducted.    AROM Assessment Site  Shoulder    Right/Left Shoulder  Right    Right Shoulder Flexion  170 Degrees 121 previous    Right Shoulder ABduction  180 Degrees 102 previous    Right Shoulder Internal Rotation  90 Degrees same as previous    Right Shoulder External Rotation  77 Degrees 76 previous      PROM   Overall PROM   Within functional limits for tasks performed    Overall PROM Comments  Assessed supine. IR/er adducted.     PROM Assessment Site  Shoulder    Right/Left Shoulder  Right    Right Shoulder Flexion  180 Degrees same as previous    Right Shoulder ABduction  180 Degrees same as previous    Right Shoulder Internal Rotation  90 Degrees same as previous    Right Shoulder External Rotation  90 Degrees 70 previous      Strength   Overall  Strength Comments  Assessed seated. IR/er adducted    Strength Assessment Site  Shoulder    Right/Left Shoulder  Right    Right Shoulder Flexion  4/5 3-/5 previous    Right Shoulder ABduction  4+/5 3-/5 previous    Right Shoulder Internal Rotation  5/5 4/5 previous    Right Shoulder External Rotation  4/5 same as previous               OT Treatments/Exercises (OP) - 12/24/17 1303      Exercises   Exercises  Shoulder      Shoulder Exercises: Supine   Horizontal ABduction  PROM;5 reps    External Rotation  PROM;5 reps    Internal Rotation  PROM;5 reps    Flexion  PROM;5 reps    ABduction  PROM;5 reps      Shoulder Exercises: Sidelying   External Rotation  Strengthening;15 reps    External Rotation Weight (lbs)  2    Internal Rotation  Strengthening;15 reps    Internal Rotation Weight (lbs)  2    Flexion  Strengthening;15 reps    Flexion Weight (lbs)  2    ABduction  Strengthening;15 reps    ABduction Weight (lbs)  2    Other Sidelying Exercises  Protraction; strengthening; 2# 15x    Other Sidelying Exercises  Horizontal abduction; strengthening; 2#, 15X                OT Short Term Goals - 12/24/17 1311      OT SHORT TERM GOAL #1   Title  Patient will be educated and independent with HEP to faciliate progress in therapy and allow her to return to using her RUE for all daily tasks.     Time  3    Period  Weeks    Status  Achieved      OT SHORT TERM GOAL #2   Title  Patient will increase P/ROM to WNL to increase ability to get shirts on and off with less difficulty.     Time  3    Period  Weeks      OT SHORT TERM GOAL #3   Title  Patient will increase RUE strength to 4-/5 to increase ability to complete activities at shoulder level with less difficulty.     Time  3    Period  Weeks    Status  Achieved      OT SHORT TERM GOAL #4   Title  Patient will decrease pain level in RUE to approximately 3/10 or less when completing daily tasks.     Time  3    Period  Weeks    Status  Partially Met      OT SHORT TERM GOAL #5   Title  Patient will decrease fascial restrictions to min amount to increase functional mobility needed to fix and brush her hair using her RUE.     Time  3    Period  Weeks    Status  Achieved        OT Long Term Goals - 12/24/17 1311      OT LONG TERM GOAL #1   Title  Patient will return to highest level of independence with daily and leisure tasks while using her right UE as dominant extremity 100% of the time.    Time  6    Period  Weeks    Status  Achieved      OT LONG TERM GOAL #2  Title  Patient will increase A/ROM to WNL to increase ability to reach overhead and out to the side with less difficulty.     Time  6    Period  Weeks    Status  Achieved      OT LONG TERM GOAL #3   Title  Patient will increase RUE strength to 4+/5 to increase ability to complete heavy household chores and lifting tasks.     Time  6    Period  Weeks    Status  Partially Met      OT LONG TERM GOAL #4   Title  Patient will decrease RUE pain to 2/10 or less when completing daily tasks.     Time  6    Period   Weeks    Status  Not Met            Plan - 12/24/17 1346    Clinical Impression Statement  A: Reassessment completed this session, pt has met 4/5 STGs and partially met remaining STG, has also met 2/4 LTGs and partially met an additional LTG. Pt has made great improvements in ROM, strength, and functional use of RUE. Pt reports using dominant RUE during all ADLs, occasional fatigue with sustained overhead tasks. Pt's primary issue at this time is increased pain, which she is managing with biofreeze and TENs unit. Reviewed HEP for strengthening, did not add any exercises rather reviewed current plan. Pt is agreeable to discharge today with HEP for continued strengthening, and plans to follow up with orthopedic MD regarding pain.     Plan  P: Discharge pt       Patient will benefit from skilled therapeutic intervention in order to improve the following deficits and impairments:  Decreased activity tolerance, Decreased strength, Decreased range of motion, Pain, Impaired UE functional use, Increased fascial restrictions  Visit Diagnosis: Other symptoms and signs involving the musculoskeletal system  Acute pain of right shoulder  Stiffness of right shoulder, not elsewhere classified    Problem List Patient Active Problem List   Diagnosis Date Noted  . Essential hypertension 09/11/2017  . Chronic kidney disease 09/11/2017  . Anemia 09/11/2017  . Prediabetes 09/11/2017   Guadelupe Sabin, OTR/L  [phone] 12/24/2017, 1:55 PM  [facility] [place], [place], [address] Phone: [phone]   Fax:  [phone]  Name: Melissa Solis MRN: [id number] Date of Birth: 01/27/1956   OCCUPATIONAL THERAPY DISCHARGE SUMMARY  Visits from Start of Care: 24  Current functional level related to goals / functional outcomes: See above. Pt has made great progress and is using RUE as dominant during all ADLs. Pt has made great improvements  with ROM and strength, as well as activity tolerance during therapy.    Remaining deficits: Pain in RUE, decreased activity tolerance with overhead tasks   Education / Equipment: HEP for strengthening Plan: Patient agrees to discharge.  Patient goals were met. Patient is being discharged due to meeting the stated rehab goals.  ?????

## 2017-12-29 ENCOUNTER — Ambulatory Visit: Payer: Self-pay | Admitting: Physician Assistant

## 2017-12-29 ENCOUNTER — Encounter: Payer: Self-pay | Admitting: Physician Assistant

## 2017-12-29 VITALS — BP 130/88 | HR 88 | Temp 97.3°F | Ht 66.0 in | Wt 218.5 lb

## 2017-12-29 DIAGNOSIS — H6122 Impacted cerumen, left ear: Secondary | ICD-10-CM

## 2017-12-29 DIAGNOSIS — H6692 Otitis media, unspecified, left ear: Secondary | ICD-10-CM

## 2017-12-29 MED ORDER — AMOXICILLIN 500 MG PO CAPS: 500.0000 mg | ORAL_CAPSULE | Freq: Three times a day (TID) | ORAL | 0 refills | Status: AC

## 2017-12-29 NOTE — Progress Notes (Signed)
BP 130/88 (BP Location: Left Arm, Patient Position: Sitting, Cuff Size: Normal)   Pulse 88   Temp (!) 97.3 F (36.3 C)   Wt 218 lb 8 oz (99.1 kg)   SpO2 99%   BMI 35.27 kg/m    Subjective:    Patient ID: Melissa Solis, female    DOB: 19-Oct-1955, 62 y.o.   MRN: [id number]  HPI: Melissa Solis is a 62 y.o. female presenting on 12/29/2017 for Ear Pain (L ear with throat pain for about a week. pt states she took alka seltzer pm since she thought she was getting a cold but it was not helpful)   HPI   Chief Complaint  Patient presents with  . Ear Pain    L ear with throat pain for about a week. pt states she took alka seltzer pm since she thought she was getting a cold but it was not helpful      Relevant past medical, surgical, family and social history reviewed and updated as indicated. Interim medical history since our last visit reviewed. Allergies and medications reviewed and updated.  Review of Systems  Constitutional: Positive for unexpected weight change. Negative for appetite change, chills, diaphoresis, fatigue and fever.  HENT: Positive for ear pain, facial swelling, sneezing and sore throat. Negative for congestion, dental problem, drooling, hearing loss, mouth sores, trouble swallowing and voice change.   Eyes: Positive for redness. Negative for pain, discharge, itching and visual disturbance.  Respiratory: Positive for wheezing. Negative for cough, choking and shortness of breath.   Cardiovascular: Positive for palpitations and leg swelling. Negative for chest pain.  Gastrointestinal: Negative for abdominal pain, blood in stool, constipation, diarrhea and vomiting.  Endocrine: Negative for cold intolerance, heat intolerance and polydipsia.  Genitourinary: Negative for decreased urine volume, dysuria and hematuria.  Musculoskeletal: Positive for arthralgias, back pain and gait problem.  Skin: Negative for rash.  Allergic/Immunologic: Positive for environmental  allergies.  Neurological: Negative for seizures, syncope, light-headedness and headaches.  Hematological: Negative for adenopathy.  Psychiatric/Behavioral: Negative for agitation, dysphoric mood and suicidal ideas. The patient is not nervous/anxious.     Per HPI unless specifically indicated above     Objective:    BP 130/88 (BP Location: Left Arm, Patient Position: Sitting, Cuff Size: Normal)   Pulse 88   Temp (!) 97.3 F (36.3 C)   Wt 218 lb 8 oz (99.1 kg)   SpO2 99%   BMI 35.27 kg/m   Wt Readings from Last 3 Encounters:  12/29/17 218 lb 8 oz (99.1 kg)  12/04/17 220 lb (99.8 kg)  11/20/17 220 lb (99.8 kg)    Physical Exam  Constitutional: She is oriented to person, place, and time. She appears well-developed and well-nourished.  HENT:  Head: Normocephalic and atraumatic.  Right Ear: Hearing, tympanic membrane, external ear and ear canal normal.  Left Ear: Hearing and external ear normal. No tenderness. A foreign body is present. Tympanic membrane is injected.  Nose: Nose normal.  Mouth/Throat: Uvula is midline and oropharynx is clear and moist. No oropharyngeal exudate.  Cerumen L ear canal. After lavage, TM visible- it is red  Neck: Neck supple.  Cardiovascular: Normal rate and regular rhythm.  Pulmonary/Chest: Effort normal and breath sounds normal. She has no wheezes.  Lymphadenopathy:    She has no cervical adenopathy.  Neurological: She is alert and oriented to person, place, and time.  Skin: Skin is warm and dry.  Psychiatric: She has a normal mood and affect. Her behavior is  normal.  Vitals reviewed.       Assessment & Plan:      Encounter Diagnoses  Name Primary?  . Left otitis media, unspecified otitis media type Yes  . Impacted cerumen of left ear    -rx amoxil.   -pt to follow up as scheduled.  RTO sooner prn worsening or new symptoms

## 2018-01-19 ENCOUNTER — Encounter: Payer: Self-pay | Admitting: Physician Assistant

## 2018-01-20 NOTE — Congregational Nurse Program (Signed)
Congregational Nurse Program Note  Date of Encounter: 01/20/2018  Past Medical History: Past Medical History:  Diagnosis Date  . Arthritis   . Chronic back pain   . Chronic shoulder pain   . Depression   . GERD (gastroesophageal reflux disease)   . Hypertension     Encounter Details: CNP Questionnaire - 12/31/17 1100      Questionnaire   Patient Status  Not Applicable    Race  Black or African Marketing executiveAmerican    Location Patient Served At  [employer] [place]    Insurance  Not Applicable    Uninsured  Uninsured (Subsequent visits/quarter)    Food  No food insecurities    Housing/Utilities  Yes, have permanent housing    Transportation  No transportation needs    Interpersonal Safety  Yes, feel physically and emotionally safe where you currently live    Medication  No medication insecurities    Medical Provider  Yes    Referrals  Not Applicable    ED Visit Averted  Not Applicable    Life-Saving Intervention Made  Not Applicable      Seen at the food pantry. No complaints. Stated she was still awaiting her disability benefits [address] Crysta Gulick RN, [place] [facility]  [phone] B P

## 2018-01-22 LAB — GLUCOSE, POCT (MANUAL RESULT ENTRY): POC Glucose: 101 mg/dl — AB (ref 70–99)

## 2018-02-03 ENCOUNTER — Other Ambulatory Visit (HOSPITAL_COMMUNITY)
Admission: RE | Admit: 2018-02-03 | Discharge: 2018-02-03 | Disposition: A | Discharge status: Home / Self Care | Payer: No Typology Code available for payment source | Source: Ambulatory Visit | Attending: Physician Assistant | Admitting: Physician Assistant

## 2018-02-03 DIAGNOSIS — N189 Chronic kidney disease, unspecified: Secondary | ICD-10-CM

## 2018-02-03 DIAGNOSIS — D649 Anemia, unspecified: Secondary | ICD-10-CM | POA: Insufficient documentation

## 2018-02-03 DIAGNOSIS — I10 Essential (primary) hypertension: Secondary | ICD-10-CM | POA: Insufficient documentation

## 2018-02-03 LAB — BASIC METABOLIC PANEL
ANION GAP: 8 (ref 5–15)
BUN: 22 mg/dL (ref 8–23)
CHLORIDE: 106 mmol/L (ref 98–111)
CO2: 24 mmol/L (ref 22–32)
Calcium: 9.4 mg/dL (ref 8.9–10.3)
Creatinine, Ser: 1.2 mg/dL — ABNORMAL HIGH (ref 0.44–1.00)
GFR calc non Af Amer: 47 mL/min — ABNORMAL LOW (ref >60)
GFR, EST AFRICAN AMERICAN: 55 mL/min — AB (ref >60)
Glucose, Bld: 106 mg/dL — ABNORMAL HIGH (ref 70–99)
POTASSIUM: 4.7 mmol/L (ref 3.5–5.1)
Sodium: 138 mmol/L (ref 135–145)

## 2018-02-03 LAB — HEMOGLOBIN AND HEMATOCRIT, BLOOD
HCT: 35.7 % — ABNORMAL LOW (ref 36.0–46.0)
Hemoglobin: 12 g/dL (ref 12.0–15.0)

## 2018-02-09 ENCOUNTER — Ambulatory Visit: Payer: Self-pay | Admitting: Physician Assistant

## 2018-02-09 ENCOUNTER — Encounter: Payer: Self-pay | Admitting: Physician Assistant

## 2018-02-09 VITALS — BP 140/92 | HR 74 | Temp 97.2°F | Ht 66.0 in | Wt 221.5 lb

## 2018-02-09 DIAGNOSIS — N189 Chronic kidney disease, unspecified: Secondary | ICD-10-CM

## 2018-02-09 DIAGNOSIS — I10 Essential (primary) hypertension: Secondary | ICD-10-CM

## 2018-02-09 MED ORDER — OMEPRAZOLE 40 MG PO CPDR: 40.0000 mg | DELAYED_RELEASE_CAPSULE | Freq: Every day | ORAL | 1 refills | Status: DC

## 2018-02-09 MED ORDER — LOSARTAN POTASSIUM 50 MG PO TABS: 50.0000 mg | ORAL_TABLET | Freq: Every day | ORAL | 1 refills | Status: DC

## 2018-02-09 MED ORDER — METOPROLOL TARTRATE 100 MG PO TABS: 100.0000 mg | ORAL_TABLET | Freq: Two times a day (BID) | ORAL | 1 refills | Status: DC

## 2018-02-09 MED ORDER — CYCLOBENZAPRINE HCL 10 MG PO TABS: 10.0000 mg | ORAL_TABLET | Freq: Three times a day (TID) | ORAL | 0 refills | Status: DC | PRN

## 2018-02-09 NOTE — Patient Instructions (Signed)
Iron-Rich Diet Iron is a mineral that helps your body to produce hemoglobin. Hemoglobin is a protein in your red blood cells that carries oxygen to your body's tissues. Eating too little iron may cause you to feel weak and tired, and it can increase your risk for infection. Eating enough iron is necessary for your body's metabolism, muscle function, and nervous system. Iron is naturally found in many foods. It can also be added to foods or fortified in foods. There are two types of dietary iron:  Heme iron. Heme iron is absorbed by the body more easily than nonheme iron. Heme iron is found in meat, poultry, and fish.  Nonheme iron. Nonheme iron is found in dietary supplements, iron-fortified grains, beans, and vegetables.  You may need to follow an iron-rich diet if:  You have been diagnosed with iron deficiency or iron-deficiency anemia.  You have a condition that prevents you from absorbing dietary iron, such as: ? Infection in your intestines. ? Celiac disease. This involves long-lasting (chronic) inflammation of your intestines.  You do not eat enough iron.  You eat a diet that is high in foods that impair iron absorption.  You have lost a lot of blood.  You have heavy bleeding during your menstrual cycle.  You are pregnant.  What is my plan? Your health care provider may help you to determine how much iron you need per day based on your condition. Generally, when a person consumes sufficient amounts of iron in the diet, the following iron needs are met:  Men. ? 14-18 years old: 11 mg per day. ? 19-50 years old: 8 mg per day.  Women. ? 14-18 years old: 15 mg per day. ? 19-50 years old: 18 mg per day. ? Over 50 years old: 8 mg per day. ? Pregnant women: 27 mg per day. ? Breastfeeding women: 9 mg per day.  What do I need to know about an iron-rich diet?  Eat fresh fruits and vegetables that are high in vitamin C along with foods that are high in iron. This will help  increase the amount of iron that your body absorbs from food, especially with foods containing nonheme iron. Foods that are high in vitamin C include oranges, peppers, tomatoes, and mango.  Take iron supplements only as directed by your health care provider. Overdose of iron can be life-threatening. If you were prescribed iron supplements, take them with orange juice or a vitamin C supplement.  Cook foods in pots and pans that are made from iron.  Eat nonheme iron-containing foods alongside foods that are high in heme iron. This helps to improve your iron absorption.  Certain foods and drinks contain compounds that impair iron absorption. Avoid eating these foods in the same meal as iron-rich foods or with iron supplements. These include: ? Coffee, black tea, and red wine. ? Milk, dairy products, and foods that are high in calcium. ? Beans, soybeans, and peas. ? Whole grains.  When eating foods that contain both nonheme iron and compounds that impair iron absorption, follow these tips to absorb iron better. ? Soak beans overnight before cooking. ? Soak whole grains overnight and drain them before using. ? Ferment flours before baking, such as using yeast in bread dough. What foods can I eat? Grains Iron-fortified breakfast cereal. Iron-fortified whole-wheat bread. Enriched rice. Sprouted grains. Vegetables Spinach. Potatoes with skin. Green peas. Broccoli. Red and green bell peppers. Fermented vegetables. Fruits Prunes. Raisins. Oranges. Strawberries. Mango. Grapefruit. Meats and Other Protein Sources   Beef liver. Oysters. Beef. Shrimp. [place]. Chicken. [place]. Sardines. Chickpeas. Nuts. Tofu. Beverages Tomato juice. Fresh orange juice. Prune juice. Hibiscus tea. Fortified instant breakfast shakes. Condiments Tahini. Fermented soy sauce. Sweets and Desserts Black-strap molasses. Other Wheat germ. The items listed above may not be a complete list of recommended foods or beverages.  Contact your dietitian for more options. What foods are not recommended? Grains Whole grains. Bran cereal. Bran flour. Oats. Vegetables Artichokes. Brussels sprouts. Kale. Fruits Blueberries. Raspberries. Strawberries. Figs. Meats and Other Protein Sources Soybeans. Products made from soy protein. Dairy Milk. Cream. Cheese. Yogurt. Cottage cheese. Beverages Coffee. Black tea. Red wine. Sweets and Desserts Cocoa. Chocolate. Ice cream. Other Basil. Oregano. Parsley. The items listed above may not be a complete list of foods and beverages to avoid. Contact your dietitian for more information. This information is not intended to replace advice given to you by your health care provider. Make sure you discuss any questions you have with your health care provider. Document Released: 01/29/2005 Document Revised: 01/05/2016 Document Reviewed: 01/12/2014 Elsevier Interactive Patient Education  [employer].

## 2018-02-09 NOTE — Progress Notes (Signed)
BP (!) 140/92 (BP Location: Right Arm, Patient Position: Sitting, Cuff Size: Normal)   Pulse 74   Temp (!) 97.2 F (36.2 C)   Ht 5\' 6"  (1.676 m)   Wt 221 lb 8 oz (100.5 kg)   SpO2 99%   BMI 35.75 kg/m    Subjective:    Patient ID: Melissa Solis, female    DOB: [birth year], 62 y.o.   MRN: [id number]  HPI: Melissa Solis is a 62 y.o. female presenting on 02/09/2018 for Hypertension   HPI  Pt was sent 25mg  hctz with instruction to take 1/2 qd.  She says she can't because it's too little to cut so she's been taking 1 whole table every other day.  She says she is feeling well.  She is happy because she says her insurance is due to start in April.    Relevant past medical, surgical, family and social history reviewed and updated as indicated. Interim medical history since our last visit reviewed. Allergies and medications reviewed and updated.   Current Outpatient Medications:  .  hydrochlorothiazide (HYDRODIURIL) 25 MG tablet, Take 25 mg by mouth every other day., Disp: , Rfl:  .  metoprolol tartrate (LOPRESSOR) 100 MG tablet, Take 1 tablet (100 mg total) by mouth 2 (two) times daily., Disp: 180 tablet, Rfl: 1 .  omeprazole (PRILOSEC) 40 MG capsule, Take 1 capsule (40 mg total) by mouth daily., Disp: 90 capsule, Rfl: 1 .  hydrochlorothiazide (MICROZIDE) 12.5 MG capsule, Take 1 capsule (12.5 mg total) by mouth daily. (Patient not taking: Reported on 02/09/2018), Disp: 30 capsule, Rfl: 1  Review of Systems  Constitutional: Positive for appetite change, fatigue and unexpected weight change. Negative for chills, diaphoresis and fever.  HENT: Positive for ear pain. Negative for congestion, dental problem, drooling, facial swelling, hearing loss, mouth sores, sneezing, sore throat, trouble swallowing and voice change.   Eyes: Positive for redness. Negative for pain, discharge, itching and visual disturbance.  Respiratory: Positive for shortness of breath and wheezing. Negative for cough  and choking.   Cardiovascular: Negative for chest pain, palpitations and leg swelling.  Gastrointestinal: Positive for constipation. Negative for abdominal pain, blood in stool, diarrhea and vomiting.  Endocrine: Negative for cold intolerance, heat intolerance and polydipsia.  Genitourinary: Negative for decreased urine volume, dysuria and hematuria.  Musculoskeletal: Positive for arthralgias, back pain and gait problem.  Skin: Negative for rash.  Allergic/Immunologic: Negative for environmental allergies.  Neurological: Negative for seizures, syncope, light-headedness and headaches.  Hematological: Negative for adenopathy.  Psychiatric/Behavioral: Negative for agitation, dysphoric mood and suicidal ideas. The patient is not nervous/anxious.     Per HPI unless specifically indicated above     Objective:    BP (!) 140/92 (BP Location: Right Arm, Patient Position: Sitting, Cuff Size: Normal)   Pulse 74   Temp (!) 97.2 F (36.2 C)   Ht 5\' 6"  (1.676 m)   Wt 221 lb 8 oz (100.5 kg)   SpO2 99%   BMI 35.75 kg/m   Wt Readings from Last 3 Encounters:  02/09/18 221 lb 8 oz (100.5 kg)  12/29/17 218 lb 8 oz (99.1 kg)  12/04/17 220 lb (99.8 kg)    Physical Exam  Constitutional: She is oriented to person, place, and time. She appears well-developed and well-nourished.  HENT:  Head: Normocephalic and atraumatic.  Neck: Neck supple.  Cardiovascular: Normal rate and regular rhythm.  Pulmonary/Chest: Effort normal and breath sounds normal.  Abdominal: Soft. Bowel sounds are normal. She exhibits no  mass. There is no hepatosplenomegaly. There is no tenderness.  Musculoskeletal: She exhibits no edema.  Lymphadenopathy:    She has no cervical adenopathy.  Neurological: She is alert and oriented to person, place, and time.  Skin: Skin is warm and dry.  Psychiatric: She has a normal mood and affect. Her behavior is normal.  Vitals reviewed.   Results for orders placed or performed during the  hospital encounter of 02/03/18  Hemoglobin and hematocrit, blood  Result Value Ref Range   Hemoglobin 12.0 12.0 - 15.0 g/dL   HCT 25.335.7 (L) 66.436.0 - 40.346.0 %  Basic metabolic panel  Result Value Ref Range   Sodium 138 135 - 145 mmol/L   Potassium 4.7 3.5 - 5.1 mmol/L   Chloride 106 98 - 111 mmol/L   CO2 24 22 - 32 mmol/L   Glucose, Bld 106 (H) 70 - 99 mg/dL   BUN 22 8 - 23 mg/dL   Creatinine, Ser 4.741.20 (H) 0.44 - 1.00 mg/dL   Calcium 9.4 8.9 - 25.910.3 mg/dL   GFR calc non Af Amer 47 (L) >60 mL/min   GFR calc Af Amer 55 (L) >60 mL/min   Anion gap 8 5 - 15      Assessment & Plan:   Encounter Diagnoses  Name Primary?  . Essential hypertension Yes  . Chronic kidney disease, unspecified CKD stage     -reviewed labs with pt -will discontinue hctz and rx losartan 50mg  qd -pt to continue other medications -counseled pt on iron-rich diet -pt given rx flexeril per her request.  Discussed it is not routine med and will not be refilled every month.  She states understanding -pt to follow up 1 month to recheck blood pressure.  RTO sooner prn

## 2018-02-12 ENCOUNTER — Ambulatory Visit: Payer: Self-pay | Admitting: Physician Assistant

## 2018-02-23 ENCOUNTER — Other Ambulatory Visit: Payer: Self-pay | Admitting: Physician Assistant

## 2018-02-23 ENCOUNTER — Telehealth: Payer: Self-pay | Admitting: Student

## 2018-02-23 MED ORDER — LOSARTAN POTASSIUM 25 MG PO TABS: 25.0000 mg | ORAL_TABLET | Freq: Every day | ORAL | 1 refills | Status: DC

## 2018-02-23 NOTE — Telephone Encounter (Signed)
PA advised for pt to start on lower dose losartan instead. Pt was notified and verbalized understanding and [facility] [place]. Rx sent and pt is aware to go pick up.

## 2018-03-12 ENCOUNTER — Encounter: Payer: Self-pay | Admitting: Physician Assistant

## 2018-03-12 ENCOUNTER — Ambulatory Visit: Payer: Self-pay | Admitting: Physician Assistant

## 2018-03-12 VITALS — BP 130/100 | HR 85 | Temp 97.5°F | Ht 66.0 in | Wt 221.5 lb

## 2018-03-12 DIAGNOSIS — N189 Chronic kidney disease, unspecified: Secondary | ICD-10-CM

## 2018-03-12 DIAGNOSIS — R5383 Other fatigue: Secondary | ICD-10-CM

## 2018-03-12 DIAGNOSIS — I10 Essential (primary) hypertension: Secondary | ICD-10-CM

## 2018-03-12 MED ORDER — HYDROCHLOROTHIAZIDE 12.5 MG PO CAPS: 12.5000 mg | ORAL_CAPSULE | Freq: Every day | ORAL | 3 refills | Status: DC

## 2018-03-12 NOTE — Progress Notes (Signed)
BP (!) 130/100 (BP Location: Left Arm, Patient Position: Sitting, Cuff Size: Normal)   Pulse 85   Temp (!) 97.5 F (36.4 C)   Ht 5\' 6"  (1.676 m)   Wt 221 lb 8 oz (100.5 kg)   SpO2 98%   BMI 35.75 kg/m    Subjective:    Patient ID: Melissa Solis, female    DOB: Aug 19, 1955, 62 y.o.   MRN: [id number]  HPI: Melissa NoonLashel Glaude is a 62 y.o. female presenting on 03/12/2018 for Hypertension   HPI  Pt is feeling well today.  Pt asks about her thyroid due to fatigue and family history of thyroid disorders.   Relevant past medical, surgical, family and social history reviewed and updated as indicated. Interim medical history since our last visit reviewed. Allergies and medications reviewed and updated.   Current Outpatient Medications:  .  cyclobenzaprine (FLEXERIL) 10 MG tablet, Take 1 tablet (10 mg total) by mouth 3 (three) times daily as needed for muscle spasms., Disp: 30 tablet, Rfl: 0 .  losartan (COZAAR) 50 MG tablet, Take 1 tablet (50 mg total) by mouth daily., Disp: 30 tablet, Rfl: 1 .  metoprolol tartrate (LOPRESSOR) 100 MG tablet, Take 1 tablet (100 mg total) by mouth 2 (two) times daily., Disp: 180 tablet, Rfl: 1 .  omeprazole (PRILOSEC) 40 MG capsule, Take 1 capsule (40 mg total) by mouth daily., Disp: 90 capsule, Rfl: 1   Review of Systems  Constitutional: Positive for appetite change and unexpected weight change. Negative for chills, diaphoresis, fatigue and fever.  HENT: Positive for ear pain. Negative for congestion, drooling, facial swelling, hearing loss, mouth sores, sneezing, sore throat, trouble swallowing and voice change.   Eyes: Positive for redness and visual disturbance. Negative for pain, discharge and itching.  Respiratory: Positive for shortness of breath and wheezing. Negative for cough and choking.   Cardiovascular: Positive for leg swelling. Negative for chest pain and palpitations.  Gastrointestinal: Positive for constipation. Negative for abdominal pain,  blood in stool, diarrhea and vomiting.  Endocrine: Negative for cold intolerance, heat intolerance and polydipsia.  Genitourinary: Negative for decreased urine volume, dysuria and hematuria.  Musculoskeletal: Positive for arthralgias, back pain and gait problem.  Skin: Negative for rash.  Allergic/Immunologic: Positive for environmental allergies.  Neurological: Negative for seizures, syncope, light-headedness and headaches.  Hematological: Negative for adenopathy.  Psychiatric/Behavioral: Negative for agitation, dysphoric mood and suicidal ideas. The patient is not nervous/anxious.     Per HPI unless specifically indicated above     Objective:    BP (!) 130/100 (BP Location: Left Arm, Patient Position: Sitting, Cuff Size: Normal)   Pulse 85   Temp (!) 97.5 F (36.4 C)   Ht 5\' 6"  (1.676 m)   Wt 221 lb 8 oz (100.5 kg)   SpO2 98%   BMI 35.75 kg/m   Wt Readings from Last 3 Encounters:  03/12/18 221 lb 8 oz (100.5 kg)  02/09/18 221 lb 8 oz (100.5 kg)  12/29/17 218 lb 8 oz (99.1 kg)    Physical Exam  Constitutional: She is oriented to person, place, and time. She appears well-developed and well-nourished.  HENT:  Head: Normocephalic and atraumatic.  Neck: Neck supple.  Cardiovascular: Normal rate and regular rhythm.  Pulmonary/Chest: Effort normal and breath sounds normal.  Abdominal: Soft. Bowel sounds are normal. She exhibits no mass. There is no hepatosplenomegaly. There is no tenderness.  Musculoskeletal: She exhibits edema (trace B edema feet).  Lymphadenopathy:    She has no cervical  adenopathy.  Neurological: She is alert and oriented to person, place, and time.  Skin: Skin is warm and dry.  Psychiatric: She has a normal mood and affect. Her behavior is normal.  Vitals reviewed.       Assessment & Plan:   Encounter Diagnoses  Name Primary?  . Essential hypertension Yes  . Chronic kidney disease, unspecified CKD stage   . Fatigue, unspecified type     -rx  hctz 12.5 to [facility] -pt to continue other meds -pt to follow up 6 weeks.  RTO sooner prn

## 2018-03-17 ENCOUNTER — Ambulatory Visit: Payer: Self-pay | Admitting: Physician Assistant

## 2018-03-17 ENCOUNTER — Encounter: Payer: Self-pay | Admitting: Physician Assistant

## 2018-03-17 VITALS — BP 140/98 | HR 89 | Temp 97.9°F | Ht 66.0 in | Wt 225.0 lb

## 2018-03-17 DIAGNOSIS — M25511 Pain in right shoulder: Secondary | ICD-10-CM

## 2018-03-17 DIAGNOSIS — Z9889 Other specified postprocedural states: Secondary | ICD-10-CM

## 2018-03-17 DIAGNOSIS — M48061 Spinal stenosis, lumbar region without neurogenic claudication: Secondary | ICD-10-CM

## 2018-03-17 NOTE — Patient Instructions (Addendum)
[facility]- Phone: [phone]

## 2018-03-17 NOTE — Progress Notes (Signed)
BP (!) 140/98 (BP Location: Left Arm, Patient Position: Sitting, Cuff Size: Normal)   Pulse 89   Temp 97.9 F (36.6 C)   Ht 5\' 6"  (1.676 m)   Wt 225 lb (102.1 kg)   SpO2 99%   BMI 36.32 kg/m    Subjective:    Patient ID: Melissa Solis, female    DOB: 24-Aug-1955, 62 y.o.   MRN: [id number]  HPI: Melissa Solis is a 62 y.o. female presenting on 03/17/2018 for Shoulder Pain (R shoulder) and Back Pain   HPI  Pt with long history shoulder and back complaints.  She had shoulder surgery prior to moving to the area.  [facility] referred her to orthopedist and she was seen by Dr Ophelia Charter.  Pt did not like him and wanted to be sent to another orthopedist and we  Dicussed this at [facility] 12/04/17 that we could not send her to another specialist just because she didn't like someone.    Pt complains today of persistent pain in the shoulder and lower back. She has no new injuries or changes.  Note from Dr Ophelia Charter states that the pt was to return to office if her pain persisted and she says she wasn't aware of that  Relevant past medical, surgical, family and social history reviewed and updated as indicated. Interim medical history since our last visit reviewed. Allergies and medications reviewed and updated.   Current Outpatient Medications:  .  cyclobenzaprine (FLEXERIL) 10 MG tablet, Take 1 tablet (10 mg total) by mouth 3 (three) times daily as needed for muscle spasms., Disp: 30 tablet, Rfl: 0 .  hydrochlorothiazide (MICROZIDE) 12.5 MG capsule, Take 1 capsule (12.5 mg total) by mouth daily., Disp: 30 capsule, Rfl: 3 .  losartan (COZAAR) 50 MG tablet, Take 1 tablet (50 mg total) by mouth daily., Disp: 30 tablet, Rfl: 1 .  metoprolol tartrate (LOPRESSOR) 100 MG tablet, Take 1 tablet (100 mg total) by mouth 2 (two) times daily., Disp: 180 tablet, Rfl: 1 .  omeprazole (PRILOSEC) 40 MG capsule, Take 1 capsule (40 mg total) by mouth daily., Disp: 90 capsule, Rfl: 1   Review of Systems  Constitutional: Positive  for appetite change and unexpected weight change. Negative for chills, diaphoresis, fatigue and fever.  HENT: Positive for dental problem. Negative for congestion, drooling, ear pain, facial swelling, hearing loss, mouth sores, sneezing, sore throat, trouble swallowing and voice change.   Eyes: Positive for visual disturbance. Negative for pain, discharge, redness and itching.  Respiratory: Negative for cough, choking, shortness of breath and wheezing.   Cardiovascular: Positive for leg swelling. Negative for chest pain and palpitations.  Gastrointestinal: Negative for abdominal pain, blood in stool, constipation, diarrhea and vomiting.  Endocrine: Negative for cold intolerance, heat intolerance and polydipsia.  Genitourinary: Negative for decreased urine volume, dysuria and hematuria.  Musculoskeletal: Positive for arthralgias, back pain and gait problem.  Skin: Negative for rash.  Allergic/Immunologic: Negative for environmental allergies.  Neurological: Negative for seizures, syncope, light-headedness and headaches.  Hematological: Negative for adenopathy.  Psychiatric/Behavioral: Negative for agitation, dysphoric mood and suicidal ideas. The patient is not nervous/anxious.     Per HPI unless specifically indicated above     Objective:    BP (!) 140/98 (BP Location: Left Arm, Patient Position: Sitting, Cuff Size: Normal)   Pulse 89   Temp 97.9 F (36.6 C)   Ht 5\' 6"  (1.676 m)   Wt 225 lb (102.1 kg)   SpO2 99%   BMI 36.32 kg/m  Wt Readings from Last 3 Encounters:  03/17/18 225 lb (102.1 kg)  03/12/18 221 lb 8 oz (100.5 kg)  02/09/18 221 lb 8 oz (100.5 kg)    Physical Exam  Constitutional: She appears well-nourished.  HENT:  Head: Atraumatic.  Pulmonary/Chest: Effort normal. No respiratory distress.  Skin: Skin is warm and dry.  Psychiatric: She has a normal mood and affect. Her behavior is normal.  Nursing note and vitals reviewed.       Assessment & Plan:     Encounter Diagnoses  Name Primary?  . Right shoulder pain, unspecified chronicity Yes  . Spinal stenosis of lumbar region, unspecified whether neurogenic claudication present   . S/P shoulder surgery      -discussed with pt Dr Ophelia CharterYates note to return to office if pain persists.  She says she will call to get appointment to be seen there for follow up -pt will follow up here as scheduled

## 2018-03-24 ENCOUNTER — Other Ambulatory Visit: Payer: Self-pay | Admitting: Physician Assistant

## 2018-03-27 ENCOUNTER — Ambulatory Visit (INDEPENDENT_AMBULATORY_CARE_PROVIDER_SITE_OTHER): Payer: Self-pay | Admitting: Orthopaedic Surgery

## 2018-03-27 ENCOUNTER — Encounter (INDEPENDENT_AMBULATORY_CARE_PROVIDER_SITE_OTHER): Payer: Self-pay | Admitting: Orthopaedic Surgery

## 2018-03-27 VITALS — BP 138/99 | HR 95 | Ht 66.0 in | Wt 225.0 lb

## 2018-03-27 DIAGNOSIS — Z981 Arthrodesis status: Secondary | ICD-10-CM

## 2018-03-27 NOTE — Progress Notes (Signed)
Office Visit Note   Patient: Melissa Solis           Date of Birth: 1956-02-03           MRN: [id number] Visit Date: 03/27/2018              Requested by: Jacquelin Hawking, PA-C [address] [place], [place] [address] PCP: Jacquelin Hawking, PA-C   Assessment & Plan: Visit Diagnoses:  1. History of lumbar fusion   2.  History of rotator cuff repair right shoulder  Plan: Patient will get to pull near her house to work on exercise program losing some weight she will get her bands to work on rotator cuff strengthening exercises keeping her elbow at her side.  We discussed weight loss helping with her hypertension and prediabetes.  Office follow-up here as needed.  Follow-Up Instructions: No follow-ups on file.   Orders:  No orders of the defined types were placed in this encounter.  No orders of the defined types were placed in this encounter.     Procedures: No procedures performed   Clinical Data: No additional findings.   Subjective: Chief Complaint  Patient presents with  . Lower Back - Pain  . Right Shoulder - Pain  . Neck - Pain    HPI exceed 62-year-old female returns for follow-up after her lumbar fusion L3-L5 done in 2013 in [place] and also rotator cuff 2019 also in [place].  Last visit in May we discussed walking program, pool therapy since she lives close to the [facility] and also some bands to help strengthen rotator cuff.  She states she is used some Biofreeze but has not done the other recommendations that we discussed.  She has some back pain at times she has some pain in her left leg.  Sometimes when she turns or twists she has some increased pain.  She is able to reach overhead with her right arm.  She has that she had past history of disc problems in her neck at one point surgery was discussed but then attention was turned to her knees her shoulder and her back.  Review of Systems 14 point review of systems updated unchanged from  11/20/2017.   Objective: Vital Signs: BP (!) 138/99   Pulse 95   Ht 5\' 6"  (1.676 m)   Wt 225 lb (102.1 kg)   BMI 36.32 kg/m   Physical Exam  Constitutional: She is oriented to person, place, and time. She appears well-developed.  HENT:  Head: Normocephalic.  Right Ear: External ear normal.  Left Ear: External ear normal.  Eyes: Pupils are equal, round, and reactive to light.  Neck: No tracheal deviation present. No thyromegaly present.  Cardiovascular: Normal rate.  Pulmonary/Chest: Effort normal.  Abdominal: Soft.  Neurological: She is alert and oriented to person, place, and time.  Skin: Skin is warm and dry.  Psychiatric: She has a normal mood and affect. Her behavior is normal.    Ortho Exam well-healed arthroscopic portals right shoulder.  Well-healed lumbar incision.  Negative straight leg raising 90 degrees slow deliberate gait.  Anterior tib gastrocsoleus fires when she ambulates.  Specialty Comments:  No specialty comments available.  Imaging: No results found.   PMFS History: Patient Active Problem List   Diagnosis Date Noted  . Essential hypertension 09/11/2017  . Chronic kidney disease 09/11/2017  . Anemia 09/11/2017  . Prediabetes 09/11/2017   Past Medical History:  Diagnosis Date  . Arthritis   . Chronic back pain   .  Chronic shoulder pain   . Depression   . GERD (gastroesophageal reflux disease)   . Hypertension     Family History  Problem Relation Age of Onset  . Hypertension Mother   . Diabetes Mother   . Hypertension Sister   . Stroke Sister   . Diabetes Brother   . Heart disease Brother   . Stroke Brother   . Hypertension Maternal Uncle   . Heart attack Maternal Uncle   . Stroke Maternal Uncle   . Hypertension Maternal Grandfather   . Heart disease Maternal Grandfather     Past Surgical History:  Procedure Laterality Date  . FOOT FRACTURE SURGERY Left   . REPLACEMENT TOTAL KNEE BILATERAL  2011  . ROTATOR CUFF REPAIR Right   .  SPINAL FUSION     Social History   Occupational History  . Not on file  Tobacco Use  . Smoking status: Former Smoker    Packs/day: 0.25    Years: 20.00    Pack years: 5.00    Types: Cigarettes    Last attempt to quit: 09/20/2016    Years since quitting: 1.5  . Smokeless tobacco: Never Used  Substance and Sexual Activity  . Alcohol use: No    Frequency: Never    Comment: wine "every now and then"  . Drug use: No  . Sexual activity: Yes    Birth control/protection: None

## 2018-04-13 ENCOUNTER — Other Ambulatory Visit (HOSPITAL_COMMUNITY)
Admission: RE | Admit: 2018-04-13 | Discharge: 2018-04-13 | Disposition: A | Discharge status: Home / Self Care | Payer: No Typology Code available for payment source | Source: Ambulatory Visit | Attending: Physician Assistant | Admitting: Physician Assistant

## 2018-04-13 ENCOUNTER — Inpatient Hospital Stay (HOSPITAL_COMMUNITY): Admit: 2018-04-13 | Payer: Self-pay

## 2018-04-13 DIAGNOSIS — N189 Chronic kidney disease, unspecified: Secondary | ICD-10-CM | POA: Insufficient documentation

## 2018-04-13 DIAGNOSIS — R5383 Other fatigue: Secondary | ICD-10-CM | POA: Insufficient documentation

## 2018-04-13 DIAGNOSIS — I10 Essential (primary) hypertension: Secondary | ICD-10-CM | POA: Insufficient documentation

## 2018-04-13 LAB — BASIC METABOLIC PANEL
ANION GAP: 7 (ref 5–15)
BUN: 15 mg/dL (ref 8–23)
CALCIUM: 9.5 mg/dL (ref 8.9–10.3)
CO2: 25 mmol/L (ref 22–32)
Chloride: 104 mmol/L (ref 98–111)
Creatinine, Ser: 1.29 mg/dL — ABNORMAL HIGH (ref 0.44–1.00)
GFR, EST AFRICAN AMERICAN: 50 mL/min — AB (ref >60)
GFR, EST NON AFRICAN AMERICAN: 43 mL/min — AB (ref >60)
GLUCOSE: 110 mg/dL — AB (ref 70–99)
POTASSIUM: 4.6 mmol/L (ref 3.5–5.1)
Sodium: 136 mmol/L (ref 135–145)

## 2018-04-13 LAB — TSH: TSH: 1.348 u[IU]/mL (ref 0.350–4.500)

## 2018-04-23 ENCOUNTER — Ambulatory Visit: Payer: Self-pay | Admitting: Physician Assistant

## 2018-04-23 ENCOUNTER — Encounter: Payer: Self-pay | Admitting: Physician Assistant

## 2018-04-23 VITALS — BP 140/100 | HR 87 | Temp 97.3°F

## 2018-04-23 DIAGNOSIS — R002 Palpitations: Secondary | ICD-10-CM

## 2018-04-23 DIAGNOSIS — K219 Gastro-esophageal reflux disease without esophagitis: Secondary | ICD-10-CM

## 2018-04-23 DIAGNOSIS — E669 Obesity, unspecified: Secondary | ICD-10-CM

## 2018-04-23 DIAGNOSIS — N189 Chronic kidney disease, unspecified: Secondary | ICD-10-CM

## 2018-04-23 DIAGNOSIS — I10 Essential (primary) hypertension: Secondary | ICD-10-CM

## 2018-04-23 MED ORDER — AMLODIPINE BESYLATE 5 MG PO TABS: 5.0000 mg | ORAL_TABLET | Freq: Every day | ORAL | 1 refills | Status: DC

## 2018-04-23 NOTE — Patient Instructions (Signed)
Palpitations A palpitation is the feeling that your heartbeat is irregular or is faster than normal. It may feel like your heart is fluttering or skipping a beat. Palpitations are usually not a serious problem. They may be caused by many things, including smoking, caffeine, alcohol, stress, and certain medicines. Although most causes of palpitations are not serious, palpitations can be a sign of a serious medical problem. In some cases, you may need further medical evaluation. Follow these instructions at home: Pay attention to any changes in your symptoms. Take these actions to help with your condition:  Avoid the following: ? Caffeinated coffee, tea, soft drinks, diet pills, and energy drinks. ? Chocolate. ? Alcohol.  Do not use any tobacco products, such as cigarettes, chewing tobacco, and e-cigarettes. If you need help quitting, ask your health care provider.  Try to reduce your stress and anxiety. Things that can help you relax include: ? Yoga. ? Meditation. ? Physical activity, such as swimming, jogging, or walking. ? Biofeedback. This is a method that helps you learn to use your mind to control things in your body, such as your heartbeats.  Get plenty of rest and sleep.  Take over-the-counter and prescription medicines only as told by your health care provider.  Keep all follow-up visits as told by your health care provider. This is important.  Contact a health care provider if:  You continue to have a fast or irregular heartbeat after 24 hours.  Your palpitations occur more often. Get help right away if:  You have chest pain or shortness of breath.  You have a severe headache.  You feel dizzy or you faint. This information is not intended to replace advice given to you by your health care provider. Make sure you discuss any questions you have with your health care provider. Document Released: 06/14/2000 Document Revised: 11/20/2015 Document Reviewed: 03/02/2015 Elsevier  Interactive Patient Education  2018 Elsevier Inc.  

## 2018-04-23 NOTE — Progress Notes (Signed)
BP (!) 156/92 (BP Location: Right Arm, Patient Position: Sitting, Cuff Size: Normal)   Pulse 87   Temp (!) 97.3 F (36.3 C)   SpO2 98%    Subjective:    Patient ID: Melissa Solis, female    DOB: Feb 15, 1956, 62 y.o.   MRN: [id number]  HPI: Melissa Solis is a 62 y.o. female presenting on 04/23/2018 for Hypertension and Palpitations (mostly at night lasting about 10 seconds. for the past week)   HPI  Chief Complaint  Patient presents with  . Hypertension  . Palpitations    mostly at night lasting about 10 seconds. for the past week    Pt drinks 1 cup of coffee daily.  She doesn't drink sodas or tea.  She doesn't eat much chocolate.  She denies CP.   Pt gets insurance in April.    Relevant past medical, surgical, family and social history reviewed and updated as indicated. Interim medical history since our last visit reviewed. Allergies and medications reviewed and updated.   Current Outpatient Medications:  .  hydrochlorothiazide (MICROZIDE) 12.5 MG capsule, Take 1 capsule (12.5 mg total) by mouth daily., Disp: 30 capsule, Rfl: 3 .  losartan (COZAAR) 50 MG tablet, Take 1 tablet (50 mg total) by mouth daily., Disp: 30 tablet, Rfl: 1 .  metoprolol tartrate (LOPRESSOR) 100 MG tablet, Take 1 tablet (100 mg total) by mouth 2 (two) times daily., Disp: 180 tablet, Rfl: 1 .  omeprazole (PRILOSEC) 40 MG capsule, Take 1 capsule (40 mg total) by mouth daily., Disp: 90 capsule, Rfl: 1 .  cyclobenzaprine (FLEXERIL) 10 MG tablet, Take 1 tablet (10 mg total) by mouth 3 (three) times daily as needed for muscle spasms. (Patient not taking: Reported on 04/23/2018), Disp: 30 tablet, Rfl: 0   Review of Systems  Constitutional: Positive for appetite change, diaphoresis and unexpected weight change. Negative for chills, fatigue and fever.  HENT: Negative for congestion, dental problem, drooling, ear pain, facial swelling, hearing loss, mouth sores, sneezing, sore throat, trouble swallowing and  voice change.   Eyes: Negative for pain, discharge, redness, itching and visual disturbance.  Respiratory: Positive for cough, shortness of breath and wheezing. Negative for choking.   Cardiovascular: Positive for palpitations and leg swelling. Negative for chest pain.  Gastrointestinal: Negative for abdominal pain, blood in stool, constipation, diarrhea and vomiting.  Endocrine: Negative for cold intolerance, heat intolerance and polydipsia.  Genitourinary: Negative for decreased urine volume, dysuria and hematuria.  Musculoskeletal: Positive for arthralgias, back pain and gait problem.  Skin: Negative for rash.  Allergic/Immunologic: Negative for environmental allergies.  Neurological: Negative for seizures, syncope, light-headedness and headaches.  Hematological: Negative for adenopathy.  Psychiatric/Behavioral: Positive for dysphoric mood. Negative for agitation and suicidal ideas. The patient is not nervous/anxious.     Per HPI unless specifically indicated above     Objective:    BP (!) 156/92 (BP Location: Right Arm, Patient Position: Sitting, Cuff Size: Normal)   Pulse 87   Temp (!) 97.3 F (36.3 C)   SpO2 98%   Wt Readings from Last 3 Encounters:  03/27/18 225 lb (102.1 kg)  03/17/18 225 lb (102.1 kg)  03/12/18 221 lb 8 oz (100.5 kg)    Physical Exam  Constitutional: She is oriented to person, place, and time. She appears well-developed and well-nourished.  HENT:  Head: Normocephalic and atraumatic.  Neck: Neck supple.  Cardiovascular: Normal rate and regular rhythm.  Pulmonary/Chest: Effort normal and breath sounds normal.  Abdominal: Soft. Bowel sounds are  normal. She exhibits no mass. There is no hepatosplenomegaly. There is no tenderness.  Musculoskeletal: She exhibits no edema.  Lymphadenopathy:    She has no cervical adenopathy.  Neurological: She is alert and oriented to person, place, and time.  Skin: Skin is warm and dry.  Psychiatric: She has a normal  mood and affect. Her behavior is normal.  Vitals reviewed.   Results for orders placed or performed during the hospital encounter of 04/13/18  TSH  Result Value Ref Range   TSH 1.348 0.350 - 4.500 uIU/mL  Basic metabolic panel  Result Value Ref Range   Sodium 136 135 - 145 mmol/L   Potassium 4.6 3.5 - 5.1 mmol/L   Chloride 104 98 - 111 mmol/L   CO2 25 22 - 32 mmol/L   Glucose, Bld 110 (H) 70 - 99 mg/dL   BUN 15 8 - 23 mg/dL   Creatinine, Ser 1.19 (H) 0.44 - 1.00 mg/dL   Calcium 9.5 8.9 - 14.7 mg/dL   GFR calc non Af Amer 43 (L) >60 mL/min   GFR calc Af Amer 50 (L) >60 mL/min   Anion gap 7 5 - 15    EKG- SR at 74 bpm.  No previous for comparison    Assessment & Plan:   Encounter Diagnoses  Name Primary?  . Essential hypertension Yes  . Palpitations   . Chronic kidney disease, unspecified CKD stage   . Gastroesophageal reflux disease, esophagitis presence not specified   . Obesity, unspecified classification, unspecified obesity type, unspecified whether serious comorbidity present     -reviewed labs wiht pt.  Reviewed EKG with pt -Pt is already on metoprolol 100 mg bid.  counseled pt to avoid caffeine and gave reading information on palpitaitons. -add amlodipine for blood pressure -discussed renal function.  Will monitor and refer to nephrology if worsens -follow up 4 weeks to recheck htn, palpitaitons.  Pt counseled to RTO sooner for worsening or the palpitations or for new symptoms

## 2018-05-20 ENCOUNTER — Encounter: Payer: Self-pay | Admitting: Physician Assistant

## 2018-05-20 ENCOUNTER — Ambulatory Visit: Payer: Self-pay | Admitting: Physician Assistant

## 2018-05-20 VITALS — BP 120/88 | HR 113 | Temp 97.9°F | Ht 66.0 in | Wt 230.8 lb

## 2018-05-20 DIAGNOSIS — M25511 Pain in right shoulder: Secondary | ICD-10-CM

## 2018-05-20 DIAGNOSIS — I10 Essential (primary) hypertension: Secondary | ICD-10-CM

## 2018-05-20 DIAGNOSIS — R000 Tachycardia, unspecified: Secondary | ICD-10-CM

## 2018-05-20 DIAGNOSIS — R002 Palpitations: Secondary | ICD-10-CM

## 2018-05-20 MED ORDER — CYCLOBENZAPRINE HCL 10 MG PO TABS: 10.0000 mg | ORAL_TABLET | Freq: Three times a day (TID) | ORAL | 0 refills | Status: DC | PRN

## 2018-05-20 NOTE — Progress Notes (Signed)
BP 120/88 (BP Location: Left Arm, Patient Position: Sitting, Cuff Size: Normal)   Pulse (!) 113   Temp 97.9 F (36.6 C)   Ht 5\' 6"  (1.676 m)   Wt 230 lb 12 oz (104.7 kg)   SpO2 97%   BMI 37.24 kg/m    Subjective:    Patient ID: [name], female    DOB: [birth year], 62 y.o.   MRN: [id number]  HPI: Jerine Surles is a 62 y.o. female presenting on 05/20/2018 for Hypertension and Palpitations   HPI   Pt states palpitations better.  bp is good today.  HR a bit fast  Pt c/o shoulder pain.  Pt was referred to orthopedist but she didn't like him.   She thinks she needs another xray of the shoulder.   Relevant past medical, surgical, family and social history reviewed and updated as indicated. Interim medical history since our last visit reviewed. Allergies and medications reviewed and updated.   Current Outpatient Medications:  .  amLODipine (NORVASC) 5 MG tablet, Take 1 tablet (5 mg total) by mouth daily., Disp: 30 tablet, Rfl: 1 .  hydrochlorothiazide (MICROZIDE) 12.5 MG capsule, Take 1 capsule (12.5 mg total) by mouth daily., Disp: 30 capsule, Rfl: 3 .  Menthol, Topical Analgesic, (BIOFREEZE ROLL-ON EX), Apply topically., Disp: , Rfl:  .  metoprolol tartrate (LOPRESSOR) 100 MG tablet, Take 1 tablet (100 mg total) by mouth 2 (two) times daily., Disp: 180 tablet, Rfl: 1 .  omeprazole (PRILOSEC) 40 MG capsule, Take 1 capsule (40 mg total) by mouth daily., Disp: 90 capsule, Rfl: 1 .  cyclobenzaprine (FLEXERIL) 10 MG tablet, Take 1 tablet (10 mg total) by mouth 3 (three) times daily as needed for muscle spasms. (Patient not taking: Reported on 04/23/2018), Disp: 30 tablet, Rfl: 0 .  losartan (COZAAR) 50 MG tablet, Take 1 tablet (50 mg total) by mouth daily. (Patient not taking: Reported on 05/20/2018), Disp: 30 tablet, Rfl: 1   Review of Systems  Constitutional: Positive for appetite change, fatigue and unexpected weight change. Negative for chills, diaphoresis and fever.  HENT:  Positive for voice change. Negative for congestion, dental problem, drooling, ear pain, facial swelling, hearing loss, mouth sores, sneezing, sore throat and trouble swallowing.   Eyes: Negative for pain, discharge, redness, itching and visual disturbance.  Respiratory: Positive for wheezing. Negative for cough, choking and shortness of breath.   Cardiovascular: Negative for chest pain, palpitations and leg swelling.  Gastrointestinal: Negative for abdominal pain, blood in stool, constipation, diarrhea and vomiting.  Endocrine: Negative for cold intolerance, heat intolerance and polydipsia.  Genitourinary: Positive for decreased urine volume. Negative for dysuria and hematuria.  Musculoskeletal: Positive for arthralgias, back pain and gait problem.  Skin: Negative for rash.  Allergic/Immunologic: Negative for environmental allergies.  Neurological: Negative for seizures, syncope, light-headedness and headaches.  Hematological: Negative for adenopathy.  Psychiatric/Behavioral: Negative for agitation, dysphoric mood and suicidal ideas. The patient is not nervous/anxious.     Per HPI unless specifically indicated above     Objective:    BP 120/88 (BP Location: Left Arm, Patient Position: Sitting, Cuff Size: Normal)   Pulse (!) 113   Temp 97.9 F (36.6 C)   Ht 5\' 6"  (1.676 m)   Wt 230 lb 12 oz (104.7 kg)   SpO2 97%   BMI 37.24 kg/m   Wt Readings from Last 3 Encounters:  05/20/18 230 lb 12 oz (104.7 kg)  03/27/18 225 lb (102.1 kg)  03/17/18 225 lb (102.1 kg)  Physical Exam  Constitutional: She is oriented to person, place, and time. She appears well-developed and well-nourished.  HENT:  Head: Normocephalic and atraumatic.  Neck: Neck supple.  Cardiovascular: Normal rate and regular rhythm.  Pulmonary/Chest: Effort normal and breath sounds normal.  Abdominal: Soft. Bowel sounds are normal. She exhibits no mass. There is no hepatosplenomegaly. There is no tenderness.   Musculoskeletal: She exhibits no edema.  Lymphadenopathy:    She has no cervical adenopathy.  Neurological: She is alert and oriented to person, place, and time.  Skin: Skin is warm and dry.  Psychiatric: She has a normal mood and affect. Her behavior is normal.  Vitals reviewed.   EKG- NSR at 94 bpm- no changes     Assessment & Plan:    Encounter Diagnoses  Name Primary?  . Essential hypertension Yes  . Tachycardia   . Palpitations   . Right shoulder pain, unspecified chronicity     -pt to Continue current meds -Pt to call to schedule herself a follow up with orthopedics if she feels she needs it (per Office note, "f/u prn").  Discussed with pt that xray would be unhelpful -Pt requests rx flexeril -Pt to follow up 2 months.  RTO sooner prn

## 2018-06-17 ENCOUNTER — Other Ambulatory Visit: Payer: Self-pay | Admitting: Physician Assistant

## 2018-06-17 MED ORDER — HYDROCHLOROTHIAZIDE 12.5 MG PO CAPS: 12.5000 mg | ORAL_CAPSULE | Freq: Every day | ORAL | 2 refills | Status: DC

## 2018-06-17 MED ORDER — AMLODIPINE BESYLATE 5 MG PO TABS: 5.0000 mg | ORAL_TABLET | Freq: Every day | ORAL | 1 refills | Status: DC

## 2018-07-20 ENCOUNTER — Encounter: Payer: Self-pay | Admitting: Physician Assistant

## 2018-07-20 ENCOUNTER — Other Ambulatory Visit (HOSPITAL_COMMUNITY)
Admission: RE | Admit: 2018-07-20 | Discharge: 2018-07-20 | Disposition: A | Discharge status: Home / Self Care | Payer: No Typology Code available for payment source | Source: Ambulatory Visit | Attending: Physician Assistant | Admitting: Physician Assistant

## 2018-07-20 ENCOUNTER — Ambulatory Visit: Payer: Self-pay | Admitting: Physician Assistant

## 2018-07-20 VITALS — BP 120/84 | HR 95 | Temp 97.3°F | Ht 66.0 in | Wt 231.0 lb

## 2018-07-20 DIAGNOSIS — I10 Essential (primary) hypertension: Secondary | ICD-10-CM

## 2018-07-20 DIAGNOSIS — N189 Chronic kidney disease, unspecified: Secondary | ICD-10-CM

## 2018-07-20 LAB — BASIC METABOLIC PANEL
ANION GAP: 9 (ref 5–15)
BUN: 17 mg/dL (ref 8–23)
CHLORIDE: 101 mmol/L (ref 98–111)
CO2: 26 mmol/L (ref 22–32)
CREATININE: 1.22 mg/dL — AB (ref 0.44–1.00)
Calcium: 9.5 mg/dL (ref 8.9–10.3)
GFR calc non Af Amer: 47 mL/min — ABNORMAL LOW (ref >60)
GFR, EST AFRICAN AMERICAN: 55 mL/min — AB (ref >60)
Glucose, Bld: 126 mg/dL — ABNORMAL HIGH (ref 70–99)
Potassium: 4.4 mmol/L (ref 3.5–5.1)
SODIUM: 136 mmol/L (ref 135–145)

## 2018-07-20 MED ORDER — AMLODIPINE BESYLATE 5 MG PO TABS: 5.0000 mg | ORAL_TABLET | Freq: Every day | ORAL | 0 refills | Status: AC

## 2018-07-20 MED ORDER — HYDROCHLOROTHIAZIDE 12.5 MG PO CAPS: 12.5000 mg | ORAL_CAPSULE | Freq: Every day | ORAL | 0 refills | Status: DC

## 2018-07-20 MED ORDER — CYCLOBENZAPRINE HCL 10 MG PO TABS: 10.0000 mg | ORAL_TABLET | Freq: Three times a day (TID) | ORAL | 0 refills | Status: AC | PRN

## 2018-07-20 NOTE — Patient Instructions (Signed)

## 2018-07-20 NOTE — Progress Notes (Signed)
BP 120/84 (BP Location: Left Arm, Patient Position: Sitting, Cuff Size: Normal)   Pulse 95   Temp (!) 97.3 F (36.3 C)   Ht 5\' 6"  (1.676 m)   Wt 231 lb (104.8 kg)   SpO2 98%   BMI 37.28 kg/m    Subjective:    Patient ID: Melissa Solis, female    DOB: 08/17/1955, 63 y.o.   MRN: [id number]  HPI: [name] is a 63 y.o. female presenting on 07/20/2018 for Hypertension and Chronic Kidney Disease   HPI   Chief Complaint  Patient presents with  . Hypertension  . Chronic Kidney Disease    Pt says her Medicare starts April 1  Pt has no new complaints today  Relevant past medical, surgical, family and social history reviewed and updated as indicated. Interim medical history since our last visit reviewed. Allergies and medications reviewed and updated.   Current Outpatient Medications:  .  amLODipine (NORVASC) 5 MG tablet, Take 1 tablet (5 mg total) by mouth daily., Disp: 30 tablet, Rfl: 1 .  hydrochlorothiazide (MICROZIDE) 12.5 MG capsule, Take 1 capsule (12.5 mg total) by mouth daily., Disp: 30 capsule, Rfl: 2 .  Menthol, Topical Analgesic, (BIOFREEZE ROLL-ON EX), Apply topically., Disp: , Rfl:  .  metoprolol tartrate (LOPRESSOR) 100 MG tablet, Take 1 tablet (100 mg total) by mouth 2 (two) times daily., Disp: 180 tablet, Rfl: 1 .  omeprazole (PRILOSEC) 40 MG capsule, Take 1 capsule (40 mg total) by mouth daily., Disp: 90 capsule, Rfl: 1    Review of Systems  Constitutional: Positive for appetite change and fatigue. Negative for chills, diaphoresis, fever and unexpected weight change.  HENT: Negative for congestion, drooling, ear pain, facial swelling, hearing loss, mouth sores, sneezing, sore throat, trouble swallowing and voice change.   Eyes: Negative for pain, discharge, redness, itching and visual disturbance.  Respiratory: Positive for shortness of breath and wheezing. Negative for cough and choking.   Cardiovascular: Positive for leg swelling. Negative for chest  pain and palpitations.  Gastrointestinal: Positive for constipation. Negative for abdominal pain, blood in stool, diarrhea and vomiting.  Endocrine: Negative for cold intolerance, heat intolerance and polydipsia.  Genitourinary: Negative for decreased urine volume, dysuria and hematuria.  Musculoskeletal: Positive for arthralgias, back pain and gait problem.  Skin: Negative for rash.  Allergic/Immunologic: Negative for environmental allergies.  Neurological: Negative for seizures, syncope, light-headedness and headaches.  Hematological: Negative for adenopathy.  Psychiatric/Behavioral: Positive for agitation and dysphoric mood. Negative for suicidal ideas. The patient is not nervous/anxious.     Per HPI unless specifically indicated above     Objective:    BP 120/84 (BP Location: Left Arm, Patient Position: Sitting, Cuff Size: Normal)   Pulse 95   Temp (!) 97.3 F (36.3 C)   Ht 5\' 6"  (1.676 m)   Wt 231 lb (104.8 kg)   SpO2 98%   BMI 37.28 kg/m   Wt Readings from Last 3 Encounters:  07/20/18 231 lb (104.8 kg)  05/20/18 230 lb 12 oz (104.7 kg)  03/27/18 225 lb (102.1 kg)    Physical Exam Vitals signs reviewed.  Constitutional:      Appearance: She is well-developed.  HENT:     Head: Normocephalic and atraumatic.  Neck:     Musculoskeletal: Neck supple.  Cardiovascular:     Rate and Rhythm: Normal rate and regular rhythm.  Pulmonary:     Effort: Pulmonary effort is normal.     Breath sounds: Normal breath sounds.  Abdominal:  General: Bowel sounds are normal.     Palpations: Abdomen is soft. There is no mass.     Tenderness: There is no abdominal tenderness.  Lymphadenopathy:     Cervical: No cervical adenopathy.  Skin:    General: Skin is warm and dry.  Neurological:     Mental Status: She is alert and oriented to person, place, and time.  Psychiatric:        Behavior: Behavior normal.         Assessment & Plan:   Encounter Diagnoses  Name Primary?  .  Essential hypertension Yes  . Chronic kidney disease, unspecified CKD stage     -Check bmp today -pt to Continue current meds -Pt requests muscle relaxer -will Not  Schedule follow up appointment due to medicare starts April 1.  Pt is encouraged to start calling now to get appointment with new PCP in April.  She can RTO prior to that time if neeed

## 2018-09-07 ENCOUNTER — Other Ambulatory Visit: Payer: Self-pay | Admitting: Physician Assistant

## 2018-10-07 ENCOUNTER — Telehealth: Payer: No Typology Code available for payment source | Admitting: Family

## 2018-10-07 DIAGNOSIS — M545 Low back pain, unspecified: Secondary | ICD-10-CM

## 2018-10-07 DIAGNOSIS — G8929 Other chronic pain: Secondary | ICD-10-CM

## 2018-10-07 MED ORDER — BACLOFEN 10 MG PO TABS: 10.0000 mg | ORAL_TABLET | Freq: Three times a day (TID) | ORAL | 0 refills | Status: AC

## 2018-10-07 MED ORDER — NAPROXEN 500 MG PO TABS: 500.0000 mg | ORAL_TABLET | Freq: Two times a day (BID) | ORAL | 0 refills | Status: AC

## 2018-10-07 NOTE — Progress Notes (Signed)
We are sorry that you are not feeling well.  Here is how we plan to help!  You need to follow up with your surgeon or primary care provider about your shoulder pain. I will go ahead and treat your back pain today, but please follow up if pain worsens or does not improve.   Approximately 5 minutes was spent documenting and reviewing patient's chart.    Based on what you have shared with me it looks like you mostly have acute back pain.  Acute back pain is defined as musculoskeletal pain that can resolve in 1-3 weeks with conservative treatment.  I have prescribed Naprosyn 500 mg twice a day non-steroid anti-inflammatory (NSAID) as well as Baclofen 10 mg every eight hours as needed which is a muscle relaxer  Some patients experience stomach irritation or in increased heartburn with anti-inflammatory drugs.  Please keep in mind that muscle relaxer's can cause fatigue and should not be taken while at work or driving.  Back pain is very common.  The pain often gets better over time.  The cause of back pain is usually not dangerous.  Most people can learn to manage their back pain on their own.  Home Care  Stay active.  Start with short walks on flat ground if you can.  Try to walk farther each day.  Do not sit, drive or stand in one place for more than 30 minutes.  Do not stay in bed.  Do not avoid exercise or work.  Activity can help your back heal faster.  Be careful when you bend or lift an object.  Bend at your knees, keep the object close to you, and do not twist.  Sleep on a firm mattress.  Lie on your side, and bend your knees.  If you lie on your back, put a pillow under your knees.  Only take medicines as told by your doctor.  Put ice on the injured area.  Put ice in a plastic bag  Place a towel between your skin and the bag  Leave the ice on for 15-20 minutes, 3-4 times a day for the first 2-3 days. 210 After that, you can switch between ice and heat packs.  Ask your doctor  about back exercises or massage.  Avoid feeling anxious or stressed.  Find good ways to deal with stress, such as exercise.  Get Help Right Way If:  Your pain does not go away with rest or medicine.  Your pain does not go away in 1 week.  You have new problems.  You do not feel well.  The pain spreads into your legs.  You cannot control when you poop (bowel movement) or pee (urinate)  You feel sick to your stomach (nauseous) or throw up (vomit)  You have belly (abdominal) pain.  You feel like you may pass out (faint).  If you develop a fever.  Make Sure you:  Understand these instructions.  Will watch your condition  Will get help right away if you are not doing well or get worse.  Your e-visit answers were reviewed by a board certified advanced clinical practitioner to complete your personal care plan.  Depending on the condition, your plan could have included both over the counter or prescription medications.  If there is a problem please reply  once you have received a response from your provider.  Your safety is important to [place].  If you have drug allergies check your prescription carefully.    You can  use MyChart to ask questions about today's visit, request a non-urgent call back, or ask for a work or school excuse for 24 hours related to this e-Visit. If it has been greater than 24 hours you will need to follow up with your provider, or enter a new e-Visit to address those concerns.  You will get an e-mail in the next two days asking about your experience.  I hope that your e-visit has been valuable and will speed your recovery. Thank you for using e-visits.

## 2018-10-08 ENCOUNTER — Other Ambulatory Visit: Payer: Self-pay | Admitting: Physician Assistant

## 2018-10-09 ENCOUNTER — Other Ambulatory Visit: Payer: Self-pay | Admitting: Physician Assistant

## 2018-10-09 MED ORDER — HYDROCHLOROTHIAZIDE 12.5 MG PO CAPS: 12.5000 mg | ORAL_CAPSULE | Freq: Every day | ORAL | 0 refills | Status: AC

## 2018-11-23 ENCOUNTER — Other Ambulatory Visit: Payer: Self-pay | Admitting: Physician Assistant

## 2018-12-29 ENCOUNTER — Other Ambulatory Visit: Payer: Self-pay | Admitting: Family

## 2018-12-29 DIAGNOSIS — N6310 Unspecified lump in the right breast, unspecified quadrant: Secondary | ICD-10-CM

## 2019-01-05 ENCOUNTER — Other Ambulatory Visit: Payer: No Typology Code available for payment source

## 2019-01-20 ENCOUNTER — Ambulatory Visit
Admission: RE | Admit: 2019-01-20 | Discharge: 2019-01-20 | Disposition: A | Discharge status: Home / Self Care | Payer: No Typology Code available for payment source | Source: Ambulatory Visit | Attending: Family | Admitting: Family

## 2019-01-20 ENCOUNTER — Ambulatory Visit
Admission: RE | Admit: 2019-01-20 | Discharge: 2019-01-20 | Disposition: A | Discharge status: Home / Self Care | Payer: Medicare Other | Source: Ambulatory Visit | Attending: Family | Admitting: Family

## 2019-01-20 ENCOUNTER — Other Ambulatory Visit: Payer: Self-pay

## 2019-01-20 DIAGNOSIS — N6310 Unspecified lump in the right breast, unspecified quadrant: Secondary | ICD-10-CM

## 2019-02-05 ENCOUNTER — Other Ambulatory Visit: Payer: Self-pay | Admitting: Family

## 2019-02-09 ENCOUNTER — Other Ambulatory Visit: Payer: Self-pay | Admitting: Family

## 2019-02-09 DIAGNOSIS — I739 Peripheral vascular disease, unspecified: Secondary | ICD-10-CM

## 2019-02-17 ENCOUNTER — Other Ambulatory Visit: Payer: Medicare Other

## 2019-02-17 ENCOUNTER — Ambulatory Visit
Admission: RE | Admit: 2019-02-17 | Discharge: 2019-02-17 | Disposition: A | Discharge status: Home / Self Care | Payer: Medicare Other | Source: Ambulatory Visit | Attending: Family | Admitting: Family

## 2019-02-17 DIAGNOSIS — I739 Peripheral vascular disease, unspecified: Secondary | ICD-10-CM

## 2019-03-26 ENCOUNTER — Other Ambulatory Visit: Payer: Self-pay

## 2019-03-26 ENCOUNTER — Encounter (HOSPITAL_COMMUNITY): Payer: Self-pay | Admitting: Emergency Medicine

## 2019-03-26 DIAGNOSIS — M5441 Lumbago with sciatica, right side: Secondary | ICD-10-CM | POA: Insufficient documentation

## 2019-03-26 DIAGNOSIS — Z79899 Other long term (current) drug therapy: Secondary | ICD-10-CM | POA: Insufficient documentation

## 2019-03-26 DIAGNOSIS — Z87891 Personal history of nicotine dependence: Secondary | ICD-10-CM | POA: Diagnosis not present

## 2019-03-26 DIAGNOSIS — N189 Chronic kidney disease, unspecified: Secondary | ICD-10-CM | POA: Insufficient documentation

## 2019-03-26 DIAGNOSIS — M5489 Other dorsalgia: Secondary | ICD-10-CM | POA: Diagnosis present

## 2019-03-26 DIAGNOSIS — I129 Hypertensive chronic kidney disease with stage 1 through stage 4 chronic kidney disease, or unspecified chronic kidney disease: Secondary | ICD-10-CM | POA: Diagnosis not present

## 2019-03-26 NOTE — ED Triage Notes (Addendum)
Pt has chronic back pain. States worsening pain x3 weeks radiates to right side. Pt states pain also shoots down left buttock and legs. Takes Vicodin and flexeril with very little relief.

## 2019-03-27 ENCOUNTER — Emergency Department (HOSPITAL_COMMUNITY)
Admission: EM | Admit: 2019-03-27 | Discharge: 2019-03-27 | Disposition: A | Discharge status: Home / Self Care | Payer: Medicare Other | Attending: Emergency Medicine | Admitting: Emergency Medicine

## 2019-03-27 DIAGNOSIS — M5441 Lumbago with sciatica, right side: Secondary | ICD-10-CM

## 2019-03-27 DIAGNOSIS — G8929 Other chronic pain: Secondary | ICD-10-CM

## 2019-03-27 MED ORDER — OXYCODONE-ACETAMINOPHEN 5-325 MG PO TABS
1.0000 | ORAL_TABLET | Freq: Once | ORAL | Status: AC
  Administered 2019-03-27: 01:00:00 1 via ORAL
  Filled 2019-03-27: qty 1

## 2019-03-27 MED ORDER — DEXAMETHASONE SODIUM PHOSPHATE 10 MG/ML IJ SOLN
10.0000 mg | Freq: Once | INTRAMUSCULAR | Status: AC
  Administered 2019-03-27: 01:00:00 10 mg via INTRAMUSCULAR
  Filled 2019-03-27: qty 1

## 2019-03-27 MED ORDER — METHYLPREDNISOLONE 4 MG PO TBPK: ORAL_TABLET | ORAL | 0 refills | Status: AC

## 2019-03-27 NOTE — Discharge Instructions (Addendum)
You were seen today for back pain.  You need to add anti-inflammatories to your pain regimen.  This is likely sciatica.  If you develop weakness, numbness, bowel or bladder difficulty you should be reevaluated.

## 2019-03-27 NOTE — ED Provider Notes (Signed)
Melissa Solis Provider Note   CSN: [id number] Arrival date & time: 03/26/19  2041     History   Chief Complaint Chief Complaint  Patient presents with  . Back Pain    HPI Melissa Solis is Solis 63 y.o. female.     HPI  This is Solis 63 year old female with Solis history of chronic back pain, hypertension, reflux, arthritis who presents with back pain.  Patient reports several day history of worsening right-sided back pain.  At baseline she has lower back pain that she states is migratory and "changes all of the time."  Currently she is having pain in the right lower back that radiates into her right leg.  She reports generalized weakness in the bilateral lower extremities.  No focal weakness or bowel or bladder difficulties.  She rates her pain at 5 out of 10.  She takes Vicodin and Flexeril at home which has helped minimally.  She denies any history of fevers, steroid use, immunosuppression, IV drug use.  Past Medical History:  Diagnosis Date  . Arthritis   . Chronic back pain   . Chronic shoulder pain   . Depression   . GERD (gastroesophageal reflux disease)   . Hypertension     Patient Active Problem List   Diagnosis Date Noted  . Essential hypertension 09/11/2017  . Chronic kidney disease 09/11/2017  . Anemia 09/11/2017  . Prediabetes 09/11/2017    Past Surgical History:  Procedure Laterality Date  . FOOT FRACTURE SURGERY Left   . REPLACEMENT TOTAL KNEE BILATERAL  2011  . ROTATOR CUFF REPAIR Right   . SPINAL FUSION       OB History    Gravida  2   Para      Term      Preterm      AB  1   Living  1     SAB      TAB  1   Ectopic      Multiple      Live Births  1            Home Medications    Prior to Admission medications   Medication Sig Start Date End Date Taking? Authorizing Provider  amLODipine (NORVASC) 5 MG tablet Take 1 tablet (5 mg total) by mouth daily. 07/20/18   Melissa Solis, Shannon, Melissa Solis  baclofen (LIORESAL) 10 MG  tablet Take 1 tablet (10 mg total) by mouth 3 (three) times daily. 10/07/18   Melissa Solis, Melissa Solis, Melissa Solis  cyclobenzaprine (FLEXERIL) 10 MG tablet Take 1 tablet (10 mg total) by mouth 3 (three) times daily as needed for muscle spasms. 07/20/18   Melissa Solis, Shannon, Melissa Solis  hydrochlorothiazide (MICROZIDE) 12.5 MG capsule Take 1 capsule (12.5 mg total) by mouth daily. 10/09/18   Melissa Solis, Shannon, Melissa Solis  Menthol, Topical Analgesic, (BIOFREEZE ROLL-ON EX) Apply topically.    Provider, Historical, Melissa Solis  methylPREDNISolone (MEDROL DOSEPAK) 4 MG TBPK tablet Take as directed on packet 03/27/19   , Melissa Maskerourtney Solis, Melissa Solis  metoprolol tartrate (LOPRESSOR) 100 MG tablet TAKE 1 Tablet  BY MOUTH TWICE DAILY 09/07/18   Melissa Solis, Shannon, Melissa Solis  naproxen (NAPROSYN) 500 MG tablet Take 1 tablet (500 mg total) by mouth 2 (two) times daily with Solis meal. 10/07/18   Melissa Solis, Melissa Solis, Melissa Solis  omeprazole (PRILOSEC) 40 MG capsule TAKE 1 Capsule BY MOUTH ONCE DAILY 09/07/18   Melissa Solis, Shannon, Melissa Solis    Family History Family History  Problem Relation Age of Onset  . Hypertension Mother   .  Diabetes Mother   . Hypertension Sister   . Stroke Sister   . Diabetes Brother   . Heart disease Brother   . Stroke Brother   . Hypertension Maternal Uncle   . Heart attack Maternal Uncle   . Stroke Maternal Uncle   . Hypertension Maternal Grandfather   . Heart disease Maternal Grandfather     Social History Social History   Tobacco Use  . Smoking status: Former Smoker    Packs/day: 0.25    Years: 20.00    Pack years: 5.00    Types: Cigarettes    Quit date: 09/20/2016    Years since quitting: 2.5  . Smokeless tobacco: Never Used  Substance Use Topics  . Alcohol use: No    Frequency: Never    Comment: wine "every now and then"  . Drug use: No     Allergies   Ace inhibitors   Review of Systems Review of Systems  Constitutional: Negative for fever.  Respiratory: Negative for shortness of breath.   Cardiovascular: Negative for chest pain.   Gastrointestinal: Negative for abdominal pain, nausea and vomiting.  Genitourinary: Negative for difficulty urinating, dysuria and hematuria.  Musculoskeletal: Positive for back pain.  Neurological: Negative for weakness and numbness.  All other systems reviewed and are negative.    Physical Exam Updated Vital Signs BP 127/89 (BP Location: Right Arm)   Pulse 99   Temp 98.7 Solis (37.1 C) (Oral)   Resp 16   Ht 1.676 m (5\' 6" )   Wt 99.3 kg   SpO2 99%   BMI 35.35 kg/m   Physical Exam Vitals signs and nursing note reviewed.  Constitutional:      Appearance: She is well-developed.     Comments: Overweight  HENT:     Head: Normocephalic and atraumatic.  Neck:     Musculoskeletal: Neck supple.  Cardiovascular:     Rate and Rhythm: Normal rate and regular rhythm.  Pulmonary:     Effort: Pulmonary effort is normal. No respiratory distress.  Abdominal:     Palpations: Abdomen is soft.  Musculoskeletal:     Comments: Tenderness to palpation right lower back near the right SI joint, negative bilateral straight leg raises  Skin:    General: Skin is warm and dry.  Neurological:     Mental Status: She is alert and oriented to person, place, and time.     Comments: 5 out of 5 strength bilateral lower extremities with plantar and dorsiflexion and flexion at the hip, no clonus  Psychiatric:        Mood and Affect: Mood normal.      ED Treatments / Results  Labs (all labs ordered are listed, but only abnormal results are displayed) Labs Reviewed - No data to display  EKG None  Radiology No results found.  Procedures Procedures (including critical care time)  Medications Ordered in ED Medications  dexamethasone (DECADRON) injection 10 mg (10 mg Intramuscular Given 03/27/19 0118)  oxyCODONE-acetaminophen (PERCOCET/ROXICET) 5-325 MG per tablet 1 tablet (1 tablet Oral Given 03/27/19 0118)     Initial Impression / Assessment and Plan / ED Course  I have reviewed the triage  vital signs and the nursing notes.  Pertinent labs & imaging results that were available during my care of the patient were reviewed by me and considered in my medical decision making (see chart for details).        Presents with acute on chronic back pain.  Mostly right-sided.  Pain that  she describes is consistent with sciatica, but she has Solis negative straight leg raise.  She is neurovascularly intact distally and without signs of symptoms of cauda equina.  Patient was given IM Decadron and Percocet.  On recheck, she states she feels much better.  She has Solis history of chronic kidney disease.  Will start on Solis Medrol Dosepak for anti-inflammatory effect.  Continue her other medications at home as prescribed  After history, exam, and medical workup I feel the patient has been appropriately medically screened and is safe for discharge home. Pertinent diagnoses were discussed with the patient. Patient was given return precautions.   Final Clinical Impressions(s) / ED Diagnoses   Final diagnoses:  Chronic right-sided low back pain with right-sided sciatica    ED Discharge Orders         Ordered    methylPREDNISolone (MEDROL DOSEPAK) 4 MG TBPK tablet     03/27/19 0215           , Melissa Masker, Melissa Solis 03/27/19 [phone]

## 2019-05-25 ENCOUNTER — Other Ambulatory Visit: Payer: Self-pay | Admitting: Family

## 2019-05-25 DIAGNOSIS — M545 Low back pain, unspecified: Secondary | ICD-10-CM

## 2019-05-25 DIAGNOSIS — M546 Pain in thoracic spine: Secondary | ICD-10-CM

## 2019-06-17 ENCOUNTER — Other Ambulatory Visit: Payer: Medicare Other

## 2019-06-19 ENCOUNTER — Other Ambulatory Visit: Payer: Medicare Other

## 2019-07-16 ENCOUNTER — Ambulatory Visit: Payer: Medicare Other | Attending: Internal Medicine

## 2019-07-16 ENCOUNTER — Other Ambulatory Visit: Payer: Self-pay

## 2019-07-16 DIAGNOSIS — Z20822 Contact with and (suspected) exposure to covid-19: Secondary | ICD-10-CM

## 2019-07-17 ENCOUNTER — Ambulatory Visit
Admission: RE | Admit: 2019-07-17 | Discharge: 2019-07-17 | Disposition: A | Discharge status: Home / Self Care | Payer: Medicare Other | Source: Ambulatory Visit | Attending: Family | Admitting: Family

## 2019-07-17 DIAGNOSIS — M546 Pain in thoracic spine: Secondary | ICD-10-CM

## 2019-07-17 DIAGNOSIS — M545 Low back pain, unspecified: Secondary | ICD-10-CM

## 2019-07-17 LAB — NOVEL CORONAVIRUS, NAA: SARS-CoV-2, NAA: NOT DETECTED

## 2019-08-15 ENCOUNTER — Ambulatory Visit: Payer: Medicare Other

## 2019-08-16 IMAGING — MG DIGITAL DIAGNOSTIC UNILATERAL RIGHT MAMMOGRAM WITH TOMO AND CAD
6 series · 7 of 18 positions shown · non-contrast
Comparison: Previous exam(s).

ACR Breast Density Category a: The breast tissue is almost entirely
fatty.

ADDENDUM:
This is an addendum to the report originally dictated on 11/27/2017.

The patient's prior mammograms have arrived and are available for
comparison. The previously identified mass in the upper outer
quadrant of the right breast at middle depth is seen dating back to
December 2014. The appearance is stable without significant change in
size or morphology. Given the greater than 2 year stability,
findings are consistent with a benign process, most likely
representing a fibroadenoma. Therefore, no further workup is
required. The impression and recommendation should read as follows:
CLINICAL DATA: 61-year-old female recalled from screening mammogram
dated 10/24/2017 for a possible right breast mass. The patient also
states she has a right axillary palpable abnormality that comes and
goes for the last several months.
EXAM:
DIGITAL DIAGNOSTIC RIGHT MAMMOGRAM WITH CAD AND TOMO
ULTRASOUND RIGHT BREAST

[R MLO synth-2D]
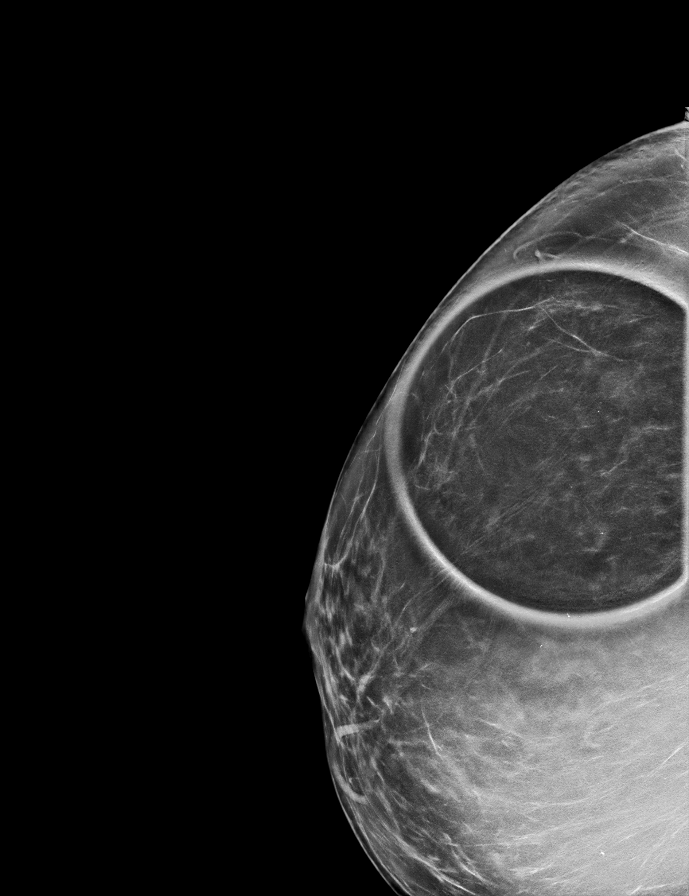

[R CC synth-2D]
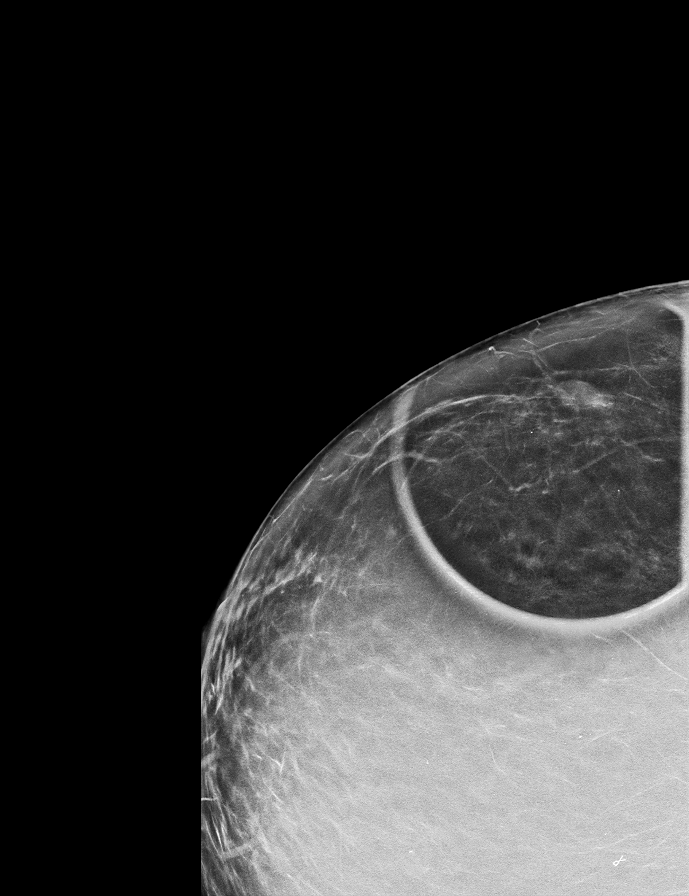

[R TAN synth-2D]
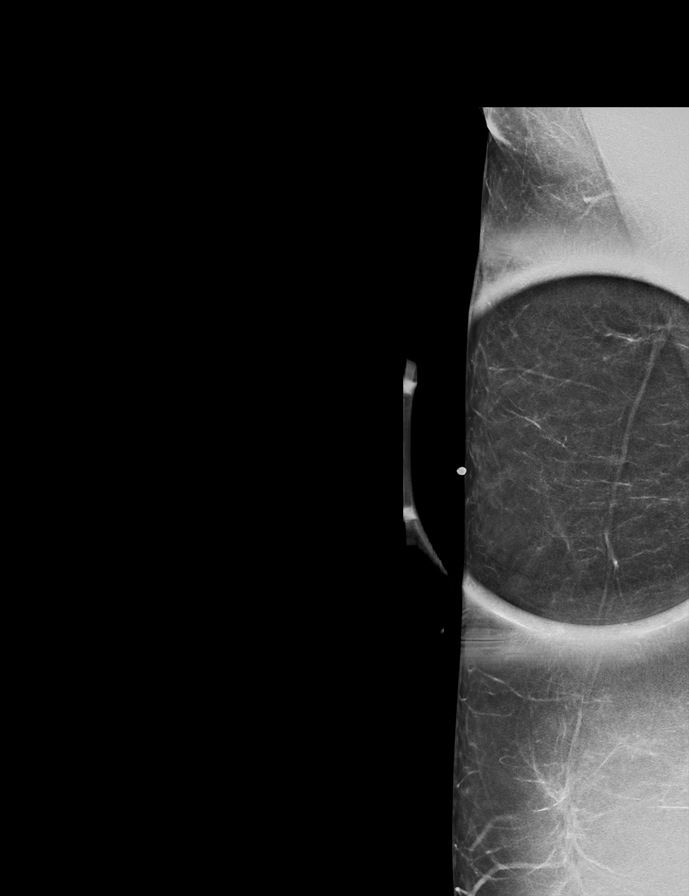

[R MLO tomo · 2 of 85 frames shown]
[frame 28/85]
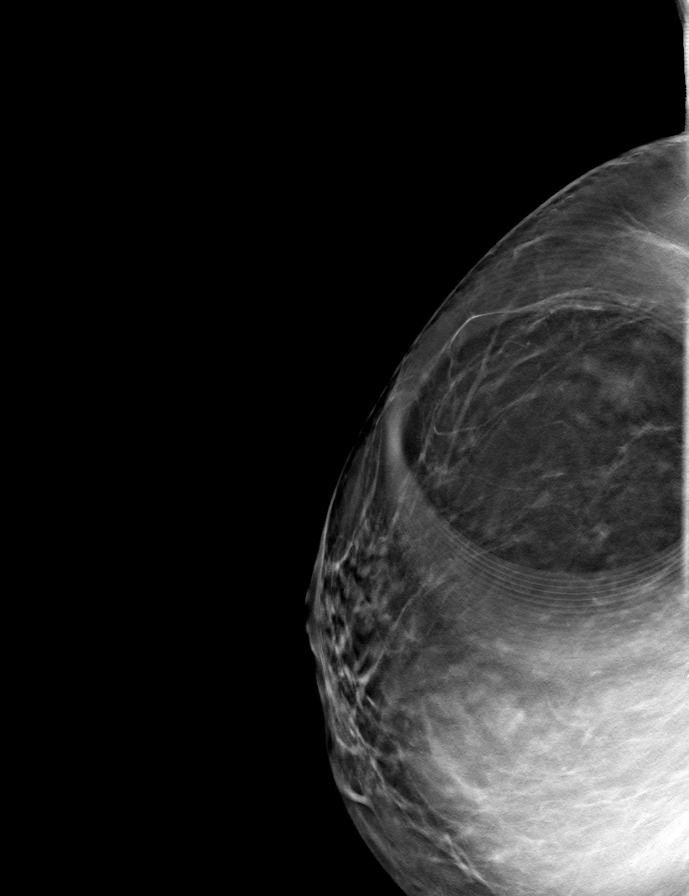
[frame 43/85]
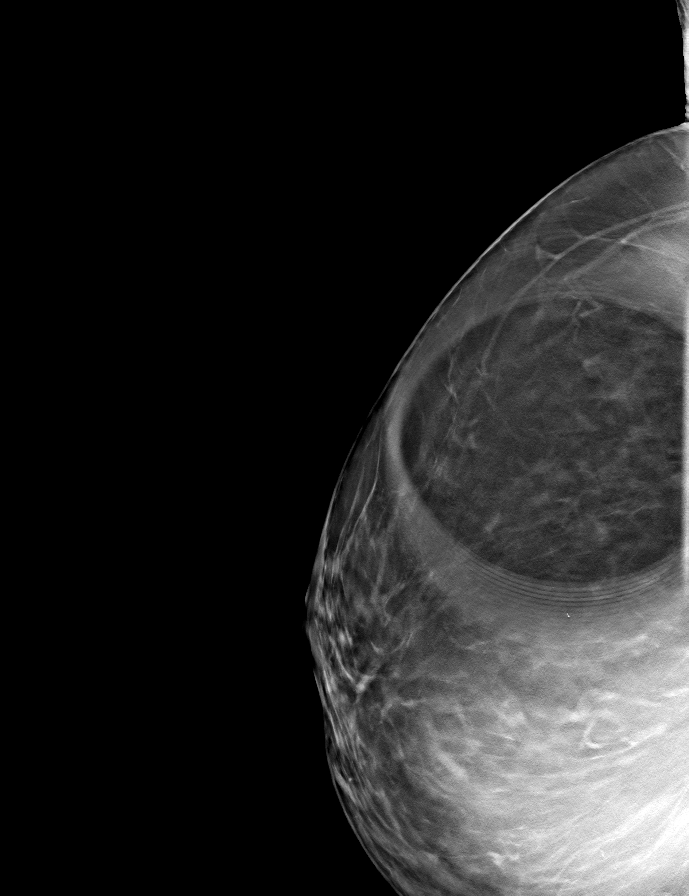

[R TAN tomo · tomo slice 29/57.0]
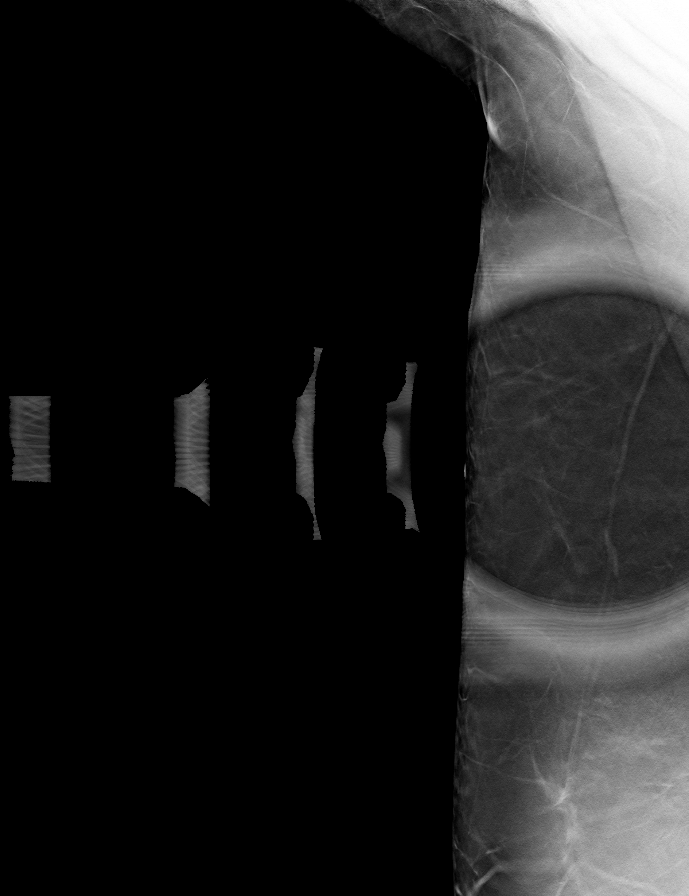

[R CC tomo · tomo slice 37/74.0]
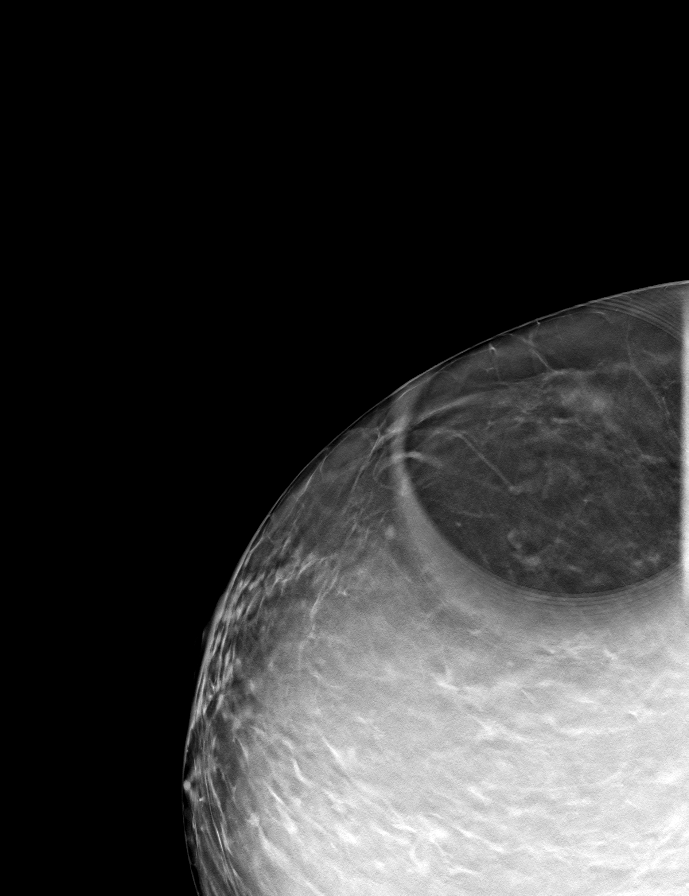

[7 of 18 positions shown; findings below may reference images not displayed]

IMPRESSION: 1. Benign right breast mass demonstrating greater than 2 year
stability when compared to patient's prior outside mammograms. No
further imaging follow-up required.
2. No mammographic or sonographic findings to explain the patient's
right axillary tail palpable abnormality. Recommendation is for
clinical and symptomatic follow-up.
3. No suspicious right axillary lymphadenopathy.

RECOMMENDATION:

1. Clinical follow-up recommended for the palpable area of concern
in the right axillary tail. Any further workup should be based on
clinical grounds.
2. Screening mammogram in one year.(Code:[id number]-[id number])

I have discussed the findings and recommendations with the patient.
Results were also provided in writing at the conclusion of the
visit. If applicable, a reminder letter will be sent to the patient
regarding the next appointment.

BI-RADS CATEGORY:

2: Benign.
FINDINGS: An oval, circumscribed isoechoic masses demonstrated in the upper
outer quadrant of the right breast at middle depth. A radiopaque BB
is placed at the site of the patient's palpable abnormality with no
additional suspicious findings identified.

Mammographic images were processed with CAD.

Targeted ultrasound is performed, showing an oval, circumscribed
hypoechoic mass at the 9 o'clock position 8 cm from the nipple. It
measures 1.1 x 1.0 x 0.4 cm. Note is made of internal blood flow.
This correlates well with the mammographic finding.

Evaluation at the axillary tail demonstrates no focal findings at
the site of the patient's palpable abnormality.

Additional evaluation of the right axilla demonstrates no suspicious
lymphadenopathy.
IMPRESSION: 1. Indeterminate right breast mass corresponding with the screening
mammographic findings. The findings may represent a fibroadenoma.
The patient's prior mammogram images have been requested and sent,
but have not arrived yet. Recommendation is for comparison to prior
mammogram to evaluate stability of these findings. If this area is
not seen on prior imaging, ultrasound-guided biopsy is recommended.
2. No mammographic or sonographic findings to explain the patient's
right axillary tail palpable abnormality. Recommendation is for
clinical and symptomatic follow-up.
3. No suspicious right axillary lymphadenopathy.

RECOMMENDATION:
1. Recommendation is to wait for the patient's prior mammograms to
arrive for comparison. If the area of concern is not definitively
seen on prior images, recommendation is for ultrasound-guided
biopsy.
2. Clinical and symptomatic follow-up is recommended for the
patient's right axillary tail palpable abnormality.

I have discussed the findings and recommendations with the patient.
Results were also provided in writing at the conclusion of the
visit. If applicable, a reminder letter will be sent to the patient
regarding the next appointment.

BI-RADS CATEGORY  0: Incomplete. Need additional imaging evaluation
and/or prior mammograms for comparison.

## 2019-08-23 ENCOUNTER — Emergency Department (HOSPITAL_COMMUNITY)
Admission: EM | Admit: 2019-08-23 | Discharge: 2019-08-23 | Disposition: A | Discharge status: Home / Self Care | Payer: Medicare Other | Attending: Emergency Medicine | Admitting: Emergency Medicine

## 2019-08-23 ENCOUNTER — Encounter (HOSPITAL_COMMUNITY): Payer: Self-pay | Admitting: Emergency Medicine

## 2019-08-23 ENCOUNTER — Other Ambulatory Visit: Payer: Self-pay

## 2019-08-23 ENCOUNTER — Emergency Department (HOSPITAL_COMMUNITY): Payer: Medicare Other

## 2019-08-23 DIAGNOSIS — S4992XA Unspecified injury of left shoulder and upper arm, initial encounter: Secondary | ICD-10-CM | POA: Insufficient documentation

## 2019-08-23 DIAGNOSIS — S4991XA Unspecified injury of right shoulder and upper arm, initial encounter: Secondary | ICD-10-CM | POA: Diagnosis not present

## 2019-08-23 DIAGNOSIS — N390 Urinary tract infection, site not specified: Secondary | ICD-10-CM | POA: Diagnosis not present

## 2019-08-23 DIAGNOSIS — Y92410 Unspecified street and highway as the place of occurrence of the external cause: Secondary | ICD-10-CM | POA: Diagnosis not present

## 2019-08-23 DIAGNOSIS — Y999 Unspecified external cause status: Secondary | ICD-10-CM | POA: Insufficient documentation

## 2019-08-23 DIAGNOSIS — Y9389 Activity, other specified: Secondary | ICD-10-CM | POA: Diagnosis not present

## 2019-08-23 DIAGNOSIS — S3992XA Unspecified injury of lower back, initial encounter: Secondary | ICD-10-CM | POA: Diagnosis present

## 2019-08-23 DIAGNOSIS — S39012A Strain of muscle, fascia and tendon of lower back, initial encounter: Secondary | ICD-10-CM | POA: Insufficient documentation

## 2019-08-23 DIAGNOSIS — S46911A Strain of unspecified muscle, fascia and tendon at shoulder and upper arm level, right arm, initial encounter: Secondary | ICD-10-CM | POA: Insufficient documentation

## 2019-08-23 DIAGNOSIS — S46912A Strain of unspecified muscle, fascia and tendon at shoulder and upper arm level, left arm, initial encounter: Secondary | ICD-10-CM | POA: Insufficient documentation

## 2019-08-23 DIAGNOSIS — G8929 Other chronic pain: Secondary | ICD-10-CM | POA: Diagnosis not present

## 2019-08-23 LAB — URINALYSIS, ROUTINE W REFLEX MICROSCOPIC
Bilirubin Urine: NEGATIVE
Glucose, UA: NEGATIVE mg/dL
Hgb urine dipstick: NEGATIVE
Ketones, ur: NEGATIVE mg/dL
Nitrite: NEGATIVE
Protein, ur: NEGATIVE mg/dL
Specific Gravity, Urine: 1.025 (ref 1.005–1.030)
pH: 5 (ref 5.0–8.0)

## 2019-08-23 MED ORDER — OXYCODONE-ACETAMINOPHEN 5-325 MG PO TABS
1.0000 | ORAL_TABLET | Freq: Once | ORAL | Status: AC
  Administered 2019-08-23: 20:00:00 1 via ORAL
  Filled 2019-08-23: qty 1

## 2019-08-23 MED ORDER — CEPHALEXIN 500 MG PO CAPS: 500.0000 mg | ORAL_CAPSULE | Freq: Four times a day (QID) | ORAL | 0 refills | Status: AC
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
## 2019-08-23 NOTE — Discharge Instructions (Addendum)
Alternate ice and heat to your lower back.  You may apply ice packs on and off to your shoulders if needed.  Continue taking your muscle relaxer and pain medication as directed.  Your urine test today shows a likely urinary tract infection.  It is important that you take the antibiotic as directed until its finished.  Be sure to follow-up with your primary doctor for recheck later this week.
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
## 2019-08-25 LAB — URINE CULTURE

## 2019-09-17 ENCOUNTER — Encounter: Payer: Self-pay | Admitting: Family

## 2020-05-15 ENCOUNTER — Other Ambulatory Visit: Payer: Self-pay | Admitting: Family

## 2020-05-15 DIAGNOSIS — E2839 Other primary ovarian failure: Secondary | ICD-10-CM

## 2020-05-16 ENCOUNTER — Ambulatory Visit
Admission: RE | Admit: 2020-05-16 | Discharge: 2020-05-16 | Disposition: A | Discharge status: Home / Self Care | Payer: Medicare Other | Source: Ambulatory Visit | Attending: Family | Admitting: Family

## 2020-05-16 ENCOUNTER — Other Ambulatory Visit: Payer: Self-pay

## 2020-05-16 DIAGNOSIS — E2839 Other primary ovarian failure: Secondary | ICD-10-CM

## 2020-07-08 ENCOUNTER — Other Ambulatory Visit: Payer: Medicare HMO

## 2020-07-08 DIAGNOSIS — Z20822 Contact with and (suspected) exposure to covid-19: Secondary | ICD-10-CM

## 2020-07-11 LAB — NOVEL CORONAVIRUS, NAA: SARS-CoV-2, NAA: NOT DETECTED

## 2020-07-12 ENCOUNTER — Telehealth: Payer: Self-pay | Admitting: Family

## 2020-07-12 NOTE — Telephone Encounter (Signed)
Pt is aware covid 19 test is neg on 07-12-2020

## 2020-09-28 ENCOUNTER — Other Ambulatory Visit: Payer: Self-pay | Admitting: Family

## 2020-09-28 DIAGNOSIS — Z1231 Encounter for screening mammogram for malignant neoplasm of breast: Secondary | ICD-10-CM

## 2020-09-28 DIAGNOSIS — E2839 Other primary ovarian failure: Secondary | ICD-10-CM

## 2020-09-28 DIAGNOSIS — Z13820 Encounter for screening for osteoporosis: Secondary | ICD-10-CM

## 2020-12-22 ENCOUNTER — Ambulatory Visit
Admission: RE | Admit: 2020-12-22 | Discharge: 2020-12-22 | Disposition: A | Discharge status: Home / Self Care | Payer: Medicare HMO | Source: Ambulatory Visit | Attending: Family | Admitting: Family

## 2020-12-22 ENCOUNTER — Other Ambulatory Visit: Payer: Self-pay

## 2020-12-22 DIAGNOSIS — Z1231 Encounter for screening mammogram for malignant neoplasm of breast: Secondary | ICD-10-CM

## 2021-05-03 ENCOUNTER — Other Ambulatory Visit: Payer: Self-pay | Admitting: Family

## 2021-05-03 DIAGNOSIS — R202 Paresthesia of skin: Secondary | ICD-10-CM

## 2021-05-03 DIAGNOSIS — M5412 Radiculopathy, cervical region: Secondary | ICD-10-CM

## 2021-05-03 DIAGNOSIS — R200 Anesthesia of skin: Secondary | ICD-10-CM

## 2021-05-23 ENCOUNTER — Other Ambulatory Visit: Payer: Self-pay

## 2021-05-23 ENCOUNTER — Ambulatory Visit
Admission: RE | Admit: 2021-05-23 | Discharge: 2021-05-23 | Disposition: A | Discharge status: Home / Self Care | Payer: Medicare Other | Source: Ambulatory Visit | Attending: Family | Admitting: Family

## 2021-05-23 DIAGNOSIS — R200 Anesthesia of skin: Secondary | ICD-10-CM

## 2021-05-23 DIAGNOSIS — R202 Paresthesia of skin: Secondary | ICD-10-CM

## 2021-05-23 DIAGNOSIS — M5412 Radiculopathy, cervical region: Secondary | ICD-10-CM

## 2021-08-24 ENCOUNTER — Encounter (HOSPITAL_COMMUNITY): Payer: Self-pay | Admitting: Emergency Medicine

## 2021-08-24 ENCOUNTER — Emergency Department (HOSPITAL_COMMUNITY)
Admission: EM | Admit: 2021-08-24 | Discharge: 2021-08-24 | Disposition: A | Discharge status: Home / Self Care | Payer: Medicare Other | Attending: Emergency Medicine | Admitting: Emergency Medicine

## 2021-08-24 ENCOUNTER — Emergency Department (HOSPITAL_COMMUNITY): Payer: Medicare Other

## 2021-08-24 ENCOUNTER — Other Ambulatory Visit: Payer: Self-pay

## 2021-08-24 DIAGNOSIS — S6991XA Unspecified injury of right wrist, hand and finger(s), initial encounter: Secondary | ICD-10-CM | POA: Diagnosis present

## 2021-08-24 DIAGNOSIS — W010XXA Fall on same level from slipping, tripping and stumbling without subsequent striking against object, initial encounter: Secondary | ICD-10-CM | POA: Insufficient documentation

## 2021-08-24 DIAGNOSIS — I10 Essential (primary) hypertension: Secondary | ICD-10-CM | POA: Insufficient documentation

## 2021-08-24 DIAGNOSIS — M25532 Pain in left wrist: Secondary | ICD-10-CM | POA: Diagnosis not present

## 2021-08-24 DIAGNOSIS — Z79899 Other long term (current) drug therapy: Secondary | ICD-10-CM | POA: Insufficient documentation

## 2021-08-24 DIAGNOSIS — W19XXXA Unspecified fall, initial encounter: Secondary | ICD-10-CM

## 2021-08-24 DIAGNOSIS — S6992XA Unspecified injury of left wrist, hand and finger(s), initial encounter: Secondary | ICD-10-CM

## 2021-08-24 MED ORDER — ACETAMINOPHEN 500 MG PO TABS
1000.0000 mg | ORAL_TABLET | Freq: Once | ORAL | Status: DC
  Filled 2021-08-24: qty 2

## 2021-08-24 NOTE — ED Triage Notes (Signed)
Pt to the ED with complaints of falling on her left hand and wrist.

## 2021-08-24 NOTE — ED Provider Notes (Signed)
[facility] EMERGENCY DEPARTMENT Provider Note   CSN: [id number] Arrival date & time: 08/24/21  3094     History  Chief Complaint  Patient presents with   Hand Injury    Melissa Solis is a 66 y.o. female.  Melissa Solis is a 67 y.o. female with a history of hypertension, hyperlipidemia, GERD, chronic back and shoulder pain, who presents to the emergency department for evaluation of pain in her left hand and wrist after a fall.  She reports that she was going down steps this morning and tripped and caught herself on her outstretched left hand, did not hit anything else or injure anything else in the fall, has some slight swelling and pain primarily over the ulnar aspect of the left wrist, able to move the hand and wrist with some discomfort.  Reports normal sensation and strength.  No wounds.  No prior injury to the wrist.  No other aggravating or alleviating factors.  The history is provided by the patient.      Home Medications Prior to Admission medications   Medication Sig Start Date End Date Taking? Authorizing Provider  albuterol (VENTOLIN HFA) 108 (90 Base) MCG/ACT inhaler Inhale 1-2 puffs into the lungs every 6 (six) hours as needed for wheezing or shortness of breath. 03/22/21  Yes [provider]  amLODipine (NORVASC) 5 MG tablet Take 1 tablet (5 mg total) by mouth daily. 07/20/18  Yes Jacquelin Hawking, PA-C  atorvastatin (LIPITOR) 40 MG tablet Take 40 mg by mouth at bedtime. 08/01/21  Yes [provider]  cyclobenzaprine (FLEXERIL) 10 MG tablet Take 1 tablet (10 mg total) by mouth 3 (three) times daily as needed for muscle spasms. 07/20/18  Yes Jacquelin Hawking, PA-C  hydrochlorothiazide (MICROZIDE) 12.5 MG capsule Take 1 capsule (12.5 mg total) by mouth daily. 10/09/18  Yes Jacquelin Hawking, PA-C  HYDROcodone-acetaminophen (NORCO) 10-325 MG tablet Take 1 tablet by mouth every 6 (six) hours as needed. 08/13/21  Yes [provider]  meloxicam (MOBIC) 7.5  MG tablet Take 7.5 mg by mouth daily. 08/06/21  Yes [provider]  Menthol, Topical Analgesic, (BIOFREEZE ROLL-ON EX) Apply 1 application topically 4 (four) times daily as needed (pain).   Yes [provider]  metoprolol tartrate (LOPRESSOR) 100 MG tablet TAKE 1 Tablet  BY MOUTH TWICE DAILY 09/07/18  Yes Jacquelin Hawking, PA-C  omeprazole (PRILOSEC) 40 MG capsule TAKE 1 Capsule BY MOUTH ONCE DAILY 09/07/18  Yes Jacquelin Hawking, PA-C  baclofen (LIORESAL) 10 MG tablet Take 1 tablet (10 mg total) by mouth 3 (three) times daily. Patient not taking: Reported on 08/24/2021 10/07/18   Jannifer Rodney A, FNP  cephALEXin (KEFLEX) 500 MG capsule Take 1 capsule (500 mg total) by mouth 4 (four) times daily. Patient not taking: Reported on 08/24/2021 08/23/19   Pauline Aus, PA-C  methylPREDNISolone (MEDROL DOSEPAK) 4 MG TBPK tablet Take as directed on packet 03/27/19   Horton, Mayer Masker, MD  naproxen (NAPROSYN) 500 MG tablet Take 1 tablet (500 mg total) by mouth 2 (two) times daily with a meal. 10/07/18   Junie Spencer, FNP      Allergies    Ace inhibitors    Review of Systems   Review of Systems  Constitutional:  Negative for chills and fever.  Musculoskeletal:  Positive for arthralgias and joint swelling.  Skin:  Negative for wound.  Neurological:  Negative for weakness and numbness.  All other systems reviewed and are negative.  Physical Exam Updated Vital Signs BP Marland Kitchen)  153/97    Pulse 97    Temp 97.8 F (36.6 C) (Oral)    Resp 15    Ht 5\' 6"  (1.676 m)    Wt 104.3 kg    SpO2 99%    BMI 37.12 kg/m  Physical Exam Vitals and nursing note reviewed.  Constitutional:      General: She is not in acute distress.    Appearance: Normal appearance. She is well-developed. She is not ill-appearing or diaphoretic.  HENT:     Head: Normocephalic and atraumatic.  Eyes:     General:        Right eye: No discharge.        Left eye: No discharge.  Pulmonary:     Effort: Pulmonary effort is  normal. No respiratory distress.  Musculoskeletal:     Comments: Tenderness and mild swelling noted over the ulnar aspect of the left wrist and fifth metacarpal without palpable bony deformity.  2+ radial and ulnar pulses and normal cap refill, normal strength and sensation.  All compartments soft  Skin:    General: Skin is warm and dry.     Capillary Refill: Capillary refill takes less than 2 seconds.  Neurological:     Mental Status: She is alert and oriented to person, place, and time.     Coordination: Coordination normal.  Psychiatric:        Mood and Affect: Mood normal.        Behavior: Behavior normal.    ED Results / Procedures / Treatments   Labs (all labs ordered are listed, but only abnormal results are displayed) Labs Reviewed - No data to display  EKG None  Radiology DG Wrist Complete Left  Result Date: 08/24/2021 CLINICAL DATA:  Pain and swelling at ulnar side of LEFT hand/wrist post fall EXAM: LEFT WRIST - COMPLETE 3+ VIEW COMPARISON:  None FINDINGS: Osseous demineralization. Advanced degenerative changes at first [facility] joint with joint space narrowing, spur formation and radial subluxation of first metacarpal base. No acute fracture, dislocation, or bone destruction. Additional degenerative changes at distal pole of scaphoid. 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 No acute osseous abnormalities. Electronically Signed   By: [facility] M.D.   On: 08/24/2021 10:52   DG Hand Complete Left  Result Date: 08/24/2021 CLINICAL DATA:  Pain and swelling at ulnar side of LEFT hand/wrist post fall EXAM: LEFT HAND - COMPLETE 3+ VIEW COMPARISON:  None FINDINGS: Osseous demineralization. Mild scattered narrowing of IP joints. Advanced degenerative changes first [facility] joint with joint space narrowing, spur formation and radial subluxation of first metacarpal base. No acute fracture, dislocation, or bone destruction. Soft tissue swelling  at dorsum of hand. IMPRESSION: Degenerative changes greatest at first [facility] joint. No acute osseous abnormalities. Electronically Signed   By: [facility] M.D.   On: 08/24/2021 10:43    Procedures Procedures    Medications Ordered in ED Medications  acetaminophen (TYLENOL) tablet 1,000 mg (1,000 mg Oral Not Given 08/24/21 1120)    ED Course/ Medical Decision Making/ A&P                           66 year old female presents with injury to the left hand and wrist after mechanical fall.  No other injuries from fall.  Tenderness and swelling over the ulnar aspect of wrist and fifth metacarpal.  Differential includes fracture, soft tissue injury, sprain.  No overlying wounds.  Plain films of the wrist ordered, independently visualized and interpreted by myself with no evidence of acute fracture.  Agree with radiologist findings.  Given reassuring evaluation and mechanical nature of fall do not feel that blood work or additional is indicated at this time.  We will place patient in Velcro wrist splint for likely wrist sprain versus soft tissue injury from fall.  Encourage patient to use Tylenol and/or Motrin as needed for pain, ice and elevation and follow-up with PCP in 1 week if symptoms not improving.  At this time feel patient is appropriate for discharge home with continued outpatient treatment.        Final Clinical Impression(s) / ED Diagnoses Final diagnoses:  Injury of left wrist, initial encounter  Fall, initial encounter    Rx / DC Orders ED Discharge Orders     None         Legrand Rams 08/24/21 1143    Mancel Bale, MD 08/27/21 1255

## 2021-08-24 NOTE — Discharge Instructions (Addendum)
X-rays do not show any fractures.  Likely a sprain or soft tissue injury.  Use the Velcro wrist brace, ice and elevate the wrist and use Motrin and/or Tylenol to help with pain.  If symptoms not improving over the next week please follow-up with your primary care provider.

## 2021-12-24 ENCOUNTER — Other Ambulatory Visit: Payer: Self-pay | Admitting: Family

## 2021-12-24 DIAGNOSIS — Z1231 Encounter for screening mammogram for malignant neoplasm of breast: Secondary | ICD-10-CM

## 2021-12-28 ENCOUNTER — Ambulatory Visit
Admission: RE | Admit: 2021-12-28 | Discharge: 2021-12-28 | Disposition: A | Discharge status: Home / Self Care | Payer: Medicare Other | Source: Ambulatory Visit | Attending: Family | Admitting: Family

## 2021-12-28 DIAGNOSIS — Z1231 Encounter for screening mammogram for malignant neoplasm of breast: Secondary | ICD-10-CM

## 2022-01-16 ENCOUNTER — Other Ambulatory Visit: Payer: Self-pay

## 2022-01-16 DIAGNOSIS — M545 Low back pain, unspecified: Secondary | ICD-10-CM

## 2022-01-30 ENCOUNTER — Ambulatory Visit
Admission: RE | Admit: 2022-01-30 | Discharge: 2022-01-30 | Disposition: A | Discharge status: Home / Self Care | Payer: Medicare Other | Source: Ambulatory Visit | Attending: Internal Medicine | Admitting: Internal Medicine

## 2022-01-30 DIAGNOSIS — M545 Low back pain, unspecified: Secondary | ICD-10-CM

## 2022-03-08 ENCOUNTER — Other Ambulatory Visit (HOSPITAL_COMMUNITY): Payer: Self-pay | Admitting: Neurosurgery

## 2022-03-08 ENCOUNTER — Other Ambulatory Visit: Payer: Self-pay | Admitting: Neurosurgery

## 2022-03-08 DIAGNOSIS — M48062 Spinal stenosis, lumbar region with neurogenic claudication: Secondary | ICD-10-CM

## 2022-03-13 ENCOUNTER — Ambulatory Visit (HOSPITAL_COMMUNITY): Payer: Medicare Other

## 2022-03-14 ENCOUNTER — Encounter (HOSPITAL_COMMUNITY): Payer: Self-pay

## 2022-03-14 ENCOUNTER — Ambulatory Visit (HOSPITAL_COMMUNITY): Admission: RE | Admit: 2022-03-14 | Payer: Medicare Other | Source: Ambulatory Visit

## 2022-10-09 ENCOUNTER — Other Ambulatory Visit (HOSPITAL_COMMUNITY): Payer: Self-pay | Admitting: Neurosurgery

## 2022-10-09 DIAGNOSIS — M48062 Spinal stenosis, lumbar region with neurogenic claudication: Secondary | ICD-10-CM

## 2022-12-11 ENCOUNTER — Encounter: Payer: Self-pay | Admitting: Family

## 2022-12-11 ENCOUNTER — Other Ambulatory Visit: Payer: Self-pay | Admitting: Family

## 2022-12-11 DIAGNOSIS — Z1231 Encounter for screening mammogram for malignant neoplasm of breast: Secondary | ICD-10-CM

## 2023-01-01 ENCOUNTER — Ambulatory Visit
Admission: RE | Admit: 2023-01-01 | Discharge: 2023-01-01 | Disposition: A | Discharge status: Home / Self Care | Payer: Medicare HMO | Source: Ambulatory Visit | Attending: Family | Admitting: Family

## 2023-01-01 DIAGNOSIS — Z1231 Encounter for screening mammogram for malignant neoplasm of breast: Secondary | ICD-10-CM

## 2023-12-01 ENCOUNTER — Other Ambulatory Visit: Payer: Self-pay

## 2023-12-01 DIAGNOSIS — Z0000 Encounter for general adult medical examination without abnormal findings: Secondary | ICD-10-CM
